# Patient Record
Sex: Male | Born: 1945 | Race: White | Hispanic: No | Marital: Married | Smoking: Former smoker
Health system: Southern US, Community
[De-identification: ages and names within clinical notes are randomized; demographics above are authoritative.]

## PROBLEM LIST (undated history)

## (undated) DIAGNOSIS — Z9189 Other specified personal risk factors, not elsewhere classified: Secondary | ICD-10-CM

## (undated) DIAGNOSIS — K5790 Diverticulosis of intestine, part unspecified, without perforation or abscess without bleeding: Secondary | ICD-10-CM

## (undated) DIAGNOSIS — M545 Low back pain, unspecified: Secondary | ICD-10-CM

## (undated) DIAGNOSIS — K5520 Angiodysplasia of colon without hemorrhage: Secondary | ICD-10-CM

## (undated) DIAGNOSIS — K648 Other hemorrhoids: Secondary | ICD-10-CM

## (undated) DIAGNOSIS — G250 Essential tremor: Secondary | ICD-10-CM

## (undated) DIAGNOSIS — R0600 Dyspnea, unspecified: Secondary | ICD-10-CM

## (undated) DIAGNOSIS — E538 Deficiency of other specified B group vitamins: Secondary | ICD-10-CM

## (undated) DIAGNOSIS — L309 Dermatitis, unspecified: Secondary | ICD-10-CM

## (undated) DIAGNOSIS — J302 Other seasonal allergic rhinitis: Secondary | ICD-10-CM

## (undated) DIAGNOSIS — E785 Hyperlipidemia, unspecified: Secondary | ICD-10-CM

## (undated) DIAGNOSIS — H269 Unspecified cataract: Secondary | ICD-10-CM

## (undated) DIAGNOSIS — I351 Nonrheumatic aortic (valve) insufficiency: Secondary | ICD-10-CM

## (undated) DIAGNOSIS — M47812 Spondylosis without myelopathy or radiculopathy, cervical region: Secondary | ICD-10-CM

## (undated) DIAGNOSIS — R0609 Other forms of dyspnea: Secondary | ICD-10-CM

## (undated) DIAGNOSIS — K5730 Diverticulosis of large intestine without perforation or abscess without bleeding: Secondary | ICD-10-CM

## (undated) DIAGNOSIS — E559 Vitamin D deficiency, unspecified: Secondary | ICD-10-CM

## (undated) DIAGNOSIS — C61 Malignant neoplasm of prostate: Secondary | ICD-10-CM

## (undated) DIAGNOSIS — I517 Cardiomegaly: Secondary | ICD-10-CM

## (undated) DIAGNOSIS — Z8042 Family history of malignant neoplasm of prostate: Secondary | ICD-10-CM

## (undated) DIAGNOSIS — Z806 Family history of leukemia: Secondary | ICD-10-CM

## (undated) DIAGNOSIS — Z87448 Personal history of other diseases of urinary system: Secondary | ICD-10-CM

## (undated) DIAGNOSIS — K635 Polyp of colon: Secondary | ICD-10-CM

## (undated) DIAGNOSIS — Z9089 Acquired absence of other organs: Secondary | ICD-10-CM

## (undated) DIAGNOSIS — Z803 Family history of malignant neoplasm of breast: Secondary | ICD-10-CM

## (undated) DIAGNOSIS — I519 Heart disease, unspecified: Secondary | ICD-10-CM

## (undated) HISTORY — DX: Diverticulosis of intestine, part unspecified, without perforation or abscess without bleeding: K57.90

## (undated) HISTORY — DX: Family history of malignant neoplasm of breast: Z80.3

## (undated) HISTORY — DX: Other specified personal risk factors, not elsewhere classified: Z91.89

## (undated) HISTORY — DX: Acquired absence of other organs: Z90.89

## (undated) HISTORY — PX: TONSILLECTOMY: SUR1361

## (undated) HISTORY — PX: OTHER SURGICAL HISTORY: SHX169

## (undated) HISTORY — DX: Hyperlipidemia, unspecified: E78.5

## (undated) HISTORY — PX: COLONOSCOPY: SHX174

## (undated) HISTORY — DX: Dermatitis, unspecified: L30.9

## (undated) HISTORY — PX: CATARACT EXTRACTION, BILATERAL: SHX1313

## (undated) HISTORY — DX: Family history of leukemia: Z80.6

## (undated) HISTORY — DX: Low back pain: M54.5

## (undated) HISTORY — DX: Family history of malignant neoplasm of prostate: Z80.42

## (undated) HISTORY — DX: Angiodysplasia of colon without hemorrhage: K55.20

## (undated) HISTORY — DX: Essential tremor: G25.0

## (undated) HISTORY — DX: Polyp of colon: K63.5

## (undated) HISTORY — DX: Low back pain, unspecified: M54.50

## (undated) HISTORY — DX: Personal history of other diseases of urinary system: Z87.448

## (undated) HISTORY — PX: POLYPECTOMY: SHX149

---

## 1996-12-09 ENCOUNTER — Encounter: Payer: Self-pay | Admitting: Internal Medicine

## 2001-04-23 ENCOUNTER — Ambulatory Visit (HOSPITAL_COMMUNITY): Admission: RE | Admit: 2001-04-23 | Discharge: 2001-04-23 | Payer: Self-pay | Admitting: Gastroenterology

## 2004-03-15 ENCOUNTER — Ambulatory Visit: Payer: Self-pay | Admitting: Internal Medicine

## 2004-05-08 ENCOUNTER — Ambulatory Visit: Payer: Self-pay | Admitting: Internal Medicine

## 2004-06-13 ENCOUNTER — Ambulatory Visit: Payer: Self-pay | Admitting: *Deleted

## 2004-11-28 ENCOUNTER — Ambulatory Visit (HOSPITAL_COMMUNITY): Admission: RE | Admit: 2004-11-28 | Discharge: 2004-11-29 | Payer: Self-pay | Admitting: Neurosurgery

## 2005-05-13 ENCOUNTER — Ambulatory Visit: Payer: Self-pay | Admitting: Internal Medicine

## 2005-05-23 ENCOUNTER — Ambulatory Visit: Payer: Self-pay | Admitting: Internal Medicine

## 2006-07-02 ENCOUNTER — Ambulatory Visit: Payer: Self-pay | Admitting: Internal Medicine

## 2006-07-02 LAB — CONVERTED CEMR LAB
ALT: 32 units/L (ref 0–40)
AST: 40 units/L — ABNORMAL HIGH (ref 0–37)
Alkaline Phosphatase: 33 units/L — ABNORMAL LOW (ref 39–117)
Basophils Relative: 1.3 % — ABNORMAL HIGH (ref 0.0–1.0)
Bilirubin, Direct: 0.1 mg/dL (ref 0.0–0.3)
CO2: 30 meq/L (ref 19–32)
Calcium: 9.4 mg/dL (ref 8.4–10.5)
Chloride: 107 meq/L (ref 96–112)
Creatinine, Ser: 1 mg/dL (ref 0.4–1.5)
Crystals: NEGATIVE
GFR calc Af Amer: 97 mL/min
GFR calc non Af Amer: 80 mL/min
Glucose, Bld: 106 mg/dL — ABNORMAL HIGH (ref 70–99)
HCT: 42.4 % (ref 39.0–52.0)
HDL: 77.5 mg/dL (ref >39.0)
Hemoglobin: 15.1 g/dL (ref 13.0–17.0)
Leukocytes, UA: NEGATIVE
MCHC: 35.6 g/dL (ref 30.0–36.0)
MCV: 91.9 fL (ref 78.0–100.0)
Monocytes Absolute: 0.4 10*3/uL (ref 0.2–0.7)
Monocytes Relative: 8.2 % (ref 3.0–11.0)
Neutrophils Relative %: 53.6 % (ref 43.0–77.0)
Nitrite: NEGATIVE
Platelets: 184 10*3/uL (ref 150–400)
RBC: 4.61 M/uL (ref 4.22–5.81)
RDW: 12.7 % (ref 11.5–14.6)
Total Protein, Urine: NEGATIVE mg/dL
Total Protein: 6.2 g/dL (ref 6.0–8.3)
Triglycerides: 71 mg/dL (ref 0–149)
VLDL: 14 mg/dL (ref 0–40)
WBC: 4.5 10*3/uL (ref 4.5–10.5)
pH: 6 (ref 5.0–8.0)

## 2006-07-04 ENCOUNTER — Ambulatory Visit: Payer: Self-pay | Admitting: Gastroenterology

## 2006-07-09 ENCOUNTER — Ambulatory Visit: Payer: Self-pay | Admitting: Internal Medicine

## 2006-07-31 ENCOUNTER — Encounter (INDEPENDENT_AMBULATORY_CARE_PROVIDER_SITE_OTHER): Payer: Self-pay | Admitting: *Deleted

## 2006-07-31 ENCOUNTER — Ambulatory Visit: Payer: Self-pay | Admitting: Gastroenterology

## 2006-07-31 LAB — HM COLONOSCOPY

## 2006-08-19 ENCOUNTER — Ambulatory Visit: Payer: Self-pay | Admitting: Internal Medicine

## 2007-05-15 ENCOUNTER — Ambulatory Visit: Payer: Self-pay | Admitting: Internal Medicine

## 2007-07-21 ENCOUNTER — Ambulatory Visit: Payer: Self-pay | Admitting: Internal Medicine

## 2007-07-21 LAB — CONVERTED CEMR LAB
AST: 37 units/L (ref 0–37)
Albumin: 3.9 g/dL (ref 3.5–5.2)
BUN: 14 mg/dL (ref 6–23)
Bacteria, UA: NEGATIVE
Basophils Absolute: 0 10*3/uL (ref 0.0–0.1)
Bilirubin Urine: NEGATIVE
Chloride: 107 meq/L (ref 96–112)
Cholesterol: 176 mg/dL (ref 0–200)
Eosinophils Absolute: 0.1 10*3/uL (ref 0.0–0.6)
Eosinophils Relative: 1.2 % (ref 0.0–5.0)
GFR calc non Af Amer: 80 mL/min
Glucose, Bld: 107 mg/dL — ABNORMAL HIGH (ref 70–99)
Ketones, ur: NEGATIVE mg/dL
MCV: 93.9 fL (ref 78.0–100.0)
Monocytes Absolute: 0.5 10*3/uL (ref 0.2–0.7)
Monocytes Relative: 10.2 % (ref 3.0–11.0)
Neutrophils Relative %: 49.4 % (ref 43.0–77.0)
Platelets: 172 10*3/uL (ref 150–400)
RDW: 12.3 % (ref 11.5–14.6)
Sodium: 142 meq/L (ref 135–145)
Specific Gravity, Urine: 1.025 (ref 1.000–1.03)
Total Protein, Urine: NEGATIVE mg/dL
Total Protein: 6 g/dL (ref 6.0–8.3)
Triglycerides: 72 mg/dL (ref 0–149)
VLDL: 14 mg/dL (ref 0–40)
WBC: 4.9 10*3/uL (ref 4.5–10.5)

## 2007-07-24 ENCOUNTER — Encounter: Payer: Self-pay | Admitting: *Deleted

## 2007-07-24 DIAGNOSIS — Z9089 Acquired absence of other organs: Secondary | ICD-10-CM

## 2007-07-24 DIAGNOSIS — E785 Hyperlipidemia, unspecified: Secondary | ICD-10-CM | POA: Insufficient documentation

## 2007-07-24 DIAGNOSIS — Z87448 Personal history of other diseases of urinary system: Secondary | ICD-10-CM | POA: Insufficient documentation

## 2007-07-24 HISTORY — DX: Acquired absence of other organs: Z90.89

## 2007-07-27 ENCOUNTER — Ambulatory Visit: Payer: Self-pay | Admitting: Internal Medicine

## 2007-07-28 ENCOUNTER — Telehealth: Payer: Self-pay | Admitting: Internal Medicine

## 2008-02-29 ENCOUNTER — Ambulatory Visit: Payer: Self-pay | Admitting: Internal Medicine

## 2008-03-08 ENCOUNTER — Ambulatory Visit: Payer: Self-pay | Admitting: Internal Medicine

## 2008-03-30 ENCOUNTER — Ambulatory Visit: Payer: Self-pay | Admitting: Internal Medicine

## 2008-03-30 LAB — CONVERTED CEMR LAB
ALT: 41 units/L (ref 0–53)
BUN: 15 mg/dL (ref 6–23)
CO2: 29 meq/L (ref 19–32)
Chloride: 106 meq/L (ref 96–112)
Creatinine, Ser: 0.9 mg/dL (ref 0.4–1.5)
GFR calc non Af Amer: 90 mL/min
Glucose, Bld: 109 mg/dL — ABNORMAL HIGH (ref 70–99)
Potassium: 4.2 meq/L (ref 3.5–5.1)
Total CHOL/HDL Ratio: 2.2
Total Protein: 6.3 g/dL (ref 6.0–8.3)
Triglycerides: 59 mg/dL (ref 0–149)
VLDL: 12 mg/dL (ref 0–40)

## 2008-05-02 ENCOUNTER — Ambulatory Visit: Payer: Self-pay | Admitting: Internal Medicine

## 2008-05-02 ENCOUNTER — Ambulatory Visit: Payer: Self-pay | Admitting: Cardiology

## 2008-08-08 ENCOUNTER — Ambulatory Visit: Payer: Self-pay | Admitting: Internal Medicine

## 2008-08-08 LAB — CONVERTED CEMR LAB
ALT: 35 units/L (ref 0–53)
AST: 41 units/L — ABNORMAL HIGH (ref 0–37)
Albumin: 4 g/dL (ref 3.5–5.2)
Alkaline Phosphatase: 39 units/L (ref 39–117)
Bacteria, UA: NEGATIVE
Basophils Relative: 0.5 % (ref 0.0–3.0)
Bilirubin, Direct: 0.1 mg/dL (ref 0.0–0.3)
Chloride: 108 meq/L (ref 96–112)
Cholesterol: 184 mg/dL (ref 0–200)
Crystals: NEGATIVE
HCT: 39.8 % (ref 39.0–52.0)
HDL: 90.1 mg/dL (ref >39.0)
LDL Cholesterol: 84 mg/dL (ref 0–99)
Leukocytes, UA: NEGATIVE
MCHC: 34.8 g/dL (ref 30.0–36.0)
MCV: 94.2 fL (ref 78.0–100.0)
Monocytes Relative: 9.6 % (ref 3.0–12.0)
Mucus, UA: NEGATIVE
Neutrophils Relative %: 49.4 % (ref 43.0–77.0)
Nitrite: NEGATIVE
PSA: 0.87 ng/mL (ref 0.10–4.00)
Platelets: 166 10*3/uL (ref 150–400)
RBC: 4.22 M/uL (ref 4.22–5.81)
Total Bilirubin: 0.9 mg/dL (ref 0.3–1.2)
Total CHOL/HDL Ratio: 2
Total Protein: 6 g/dL (ref 6.0–8.3)
Triglycerides: 49 mg/dL (ref 0–149)
Urobilinogen, UA: 0.2 (ref 0.0–1.0)
WBC: 5.1 10*3/uL (ref 4.5–10.5)
pH: 5.5 (ref 5.0–8.0)

## 2008-08-15 ENCOUNTER — Ambulatory Visit: Payer: Self-pay | Admitting: Internal Medicine

## 2009-06-27 ENCOUNTER — Encounter: Payer: Self-pay | Admitting: Internal Medicine

## 2009-07-18 ENCOUNTER — Encounter: Payer: Self-pay | Admitting: Internal Medicine

## 2009-08-14 ENCOUNTER — Ambulatory Visit: Payer: Self-pay | Admitting: Internal Medicine

## 2009-08-14 LAB — CONVERTED CEMR LAB
AST: 43 units/L — ABNORMAL HIGH (ref 0–37)
Alkaline Phosphatase: 38 units/L — ABNORMAL LOW (ref 39–117)
Basophils Relative: 0.8 % (ref 0.0–3.0)
Bilirubin Urine: NEGATIVE
Bilirubin, Direct: 0.2 mg/dL (ref 0.0–0.3)
CO2: 31 meq/L (ref 19–32)
Cholesterol: 178 mg/dL (ref 0–200)
Creatinine, Ser: 0.8 mg/dL (ref 0.4–1.5)
Eosinophils Relative: 1.7 % (ref 0.0–5.0)
GFR calc non Af Amer: 102.84 mL/min (ref >60)
Lymphocytes Relative: 41.6 % (ref 12.0–46.0)
Lymphs Abs: 2 10*3/uL (ref 0.7–4.0)
MCHC: 33.5 g/dL (ref 30.0–36.0)
MCV: 95.9 fL (ref 78.0–100.0)
Monocytes Absolute: 0.4 10*3/uL (ref 0.1–1.0)
Monocytes Relative: 8.8 % (ref 3.0–12.0)
Neutro Abs: 2.2 10*3/uL (ref 1.4–7.7)
Neutrophils Relative %: 47.1 % (ref 43.0–77.0)
Platelets: 158 10*3/uL (ref 150.0–400.0)
Specific Gravity, Urine: >=1.03 (ref 1.000–1.030)
Total Protein, Urine: NEGATIVE mg/dL
Urine Glucose: NEGATIVE mg/dL
VLDL: 13.8 mg/dL (ref 0.0–40.0)

## 2009-08-17 ENCOUNTER — Ambulatory Visit: Payer: Self-pay | Admitting: Internal Medicine

## 2010-02-08 ENCOUNTER — Encounter: Payer: Self-pay | Admitting: Internal Medicine

## 2010-02-14 ENCOUNTER — Encounter: Payer: Self-pay | Admitting: Internal Medicine

## 2010-04-19 ENCOUNTER — Ambulatory Visit: Payer: Self-pay | Admitting: Vascular Surgery

## 2010-04-19 ENCOUNTER — Ambulatory Visit
Admission: RE | Admit: 2010-04-19 | Discharge: 2010-04-19 | Payer: Self-pay | Source: Home / Self Care | Admitting: Orthopedic Surgery

## 2010-04-19 ENCOUNTER — Encounter: Payer: Self-pay | Admitting: Internal Medicine

## 2010-04-19 ENCOUNTER — Encounter (INDEPENDENT_AMBULATORY_CARE_PROVIDER_SITE_OTHER): Payer: Self-pay | Admitting: Orthopedic Surgery

## 2010-06-07 ENCOUNTER — Encounter: Payer: Self-pay | Admitting: Internal Medicine

## 2010-06-07 ENCOUNTER — Ambulatory Visit
Admission: RE | Admit: 2010-06-07 | Discharge: 2010-06-07 | Payer: Self-pay | Source: Home / Self Care | Attending: Internal Medicine | Admitting: Internal Medicine

## 2010-06-24 ENCOUNTER — Encounter: Payer: Self-pay | Admitting: Cardiovascular Disease

## 2010-06-24 ENCOUNTER — Encounter: Payer: Self-pay | Admitting: Internal Medicine

## 2010-06-24 ENCOUNTER — Encounter: Payer: Self-pay | Admitting: Orthopedic Surgery

## 2010-07-03 NOTE — Letter (Signed)
Summary: [facility]   Imported By: Lennie Odor 03/06/2010 14:50:20  _____________________________________________________________________  External Attachment:    Type:   Image     Comment:   External Document

## 2010-07-03 NOTE — Letter (Signed)
Summary: [facility]  [facility]   Imported By: Lennie Odor 08/02/2009 15:10:10  _____________________________________________________________________  External Attachment:    Type:   Image     Comment:   External Document

## 2010-07-03 NOTE — Letter (Signed)
Summary: [facility]   [facility]   Imported By: Sherian Rein 07/10/2009 10:26:02  _____________________________________________________________________  External Attachment:    Type:   Image     Comment:   External Document

## 2010-07-03 NOTE — Assessment & Plan Note (Signed)
Summary: CPX / [employer] / #/ CD   Vital Signs:  Patient profile:   66 year old male Height:      68 inches Weight:      163 pounds BMI:     24.87 O2 Sat:      98 % on Room air Temp:     97.3 degrees F oral Pulse rate:   63 / minute BP sitting:   136 / 72  (left arm) Cuff size:   regular  Vitals Entered By: Bill Salinas CMA (August 17, 2009 10:09 AM)  O2 Flow:  Room air CC: pt here for physical/he has never had a pneumonia shot and is due for td booster/ ab   Primary Care Provider:  Jacques Navy MD  CC:  pt here for physical/he has never had a pneumonia shot and is due for td booster/ ab.  History of Present Illness: Patient presents for routine medical evaluation.   In the interval since his last visit he did have recurrent back pain. He was seen by Dr. Newell Coral and underwent contrast enhanced MRI of spine which did not reveal any critical degenerative disc disease. His symptoms were managed conservatively with NSAIDs and muscle relaxants with good results. For prevention he does do palides which includes several good back postures.   There have been no other problems and he is feeling well and doing well.   Current Medications (verified): 1)  Aspirin 81 Mg  Tabs (Aspirin) .... Take One Tablet Once Daily 2)  Lipitor 20 Mg  Tabs (Atorvastatin Calcium) .... Take One Tablet Once Daily  Allergies (verified): No Known Drug Allergies  Past History:  Past Medical History: Last updated: 08/15/2008 HEMATURIA, MICROSCOPIC, HX OF (ICD-V13.09) Hx of LOW BACK PAIN, CHRONIC WITH BACK SPASM (ICD-724.2) * Hx of CHRONIC ECZEMA OF THE PLANTAR ASPECT RIGHT FOOT CHICKENPOX, HX OF AS A CHILD (ICD-V15.9) HYPERLIPIDEMIA (ICD-272.4)     Past Surgical History: Last updated: 07/24/2007 * Hx of REPAIR OF DEVIATED SEPTUM * Hx of LEFT L5 HEMILAMINOTOMY WITH MICRODISKECTOMY TONSILLECTOMY AND ADENOIDECTOMY, HX OF (ICD-V45.79)  Family History: Last updated: August 05, 2008 father-deceased @ 19;  CAD/MI-fatal father - deceased @ 31, CAD/MI Maternal Grandmother - DM  He has an extensive family history of       cardiovascular disease  Social History: Last updated: 07/27/2007 JDGala Lewandowsky. [place] 2 daughter: '69, '72   6 grandchildren ( 2 & 4) marriage in good health work: full -time Social worker firm.  Risk Factors: Caffeine Use: 4 (07/27/2007) Exercise: yes (07/27/2007)  Risk Factors: Smoking Status: quit (07/24/2007)  Review of Systems  The patient denies anorexia, fever, weight loss, weight gain, vision loss, decreased hearing, hoarseness, chest pain, syncope, dyspnea on exertion, peripheral edema, prolonged cough, headaches, abdominal pain, melena, hematochezia, severe indigestion/heartburn, hematuria, incontinence, muscle weakness, suspicious skin lesions, difficulty walking, depression, unusual weight change, enlarged lymph nodes, angioedema, and testicular masses.    Physical Exam  General:  Well nourished and developed male who looks very fit and trim.  Head:  Normocephalic and atraumatic without obvious abnormalities. No apparent alopecia or balding. Eyes:  No corneal or conjunctival inflammation noted. EOMI. Perrla. Funduscopic exam benign, without hemorrhages, exudates or papilledema. Vision grossly normal. Ears:  External ear exam shows no significant lesions or deformities.  Otoscopic examination reveals clear canals, tympanic membranes are intact bilaterally without bulging, retraction, inflammation or discharge. Hearing is grossly normal bilaterally. Nose:  no external deformity and no external erythema.   Mouth:  Oral  mucosa and oropharynx without lesions or exudates.  Teeth in good repair. Neck:  supple, full ROM, no masses, no thyromegaly, and no carotid bruits.   Chest Wall:  No deformities, masses, tenderness or gynecomastia noted. Lungs:  Normal respiratory effort, chest expands symmetrically. Lungs are clear to auscultation, no crackles or wheezes. Heart:   Normal rate and regular rhythm. S1 and S2 normal without gallop, murmur, click, rub or other extra sounds. Abdomen:  soft, non-tender, normal bowel sounds, no distention, no guarding, and no hepatomegaly.   Rectal:  No external abnormalities noted. Normal sphincter tone. No rectal masses or tenderness. Prostate:  Prostate gland firm and smooth, no enlargement, nodularity, tenderness, mass, asymmetry or induration. Msk:  normal ROM, no joint tenderness, no joint swelling, no joint warmth, no joint deformities, and no joint instability.  Normal range of motion back with no palpable tenderness or muscle spasm Pulses:  2+ radial and DP pulses Extremities:  No clubbing, cyanosis, edema, or deformity noted with normal full range of motion of all joints.   Neurologic:  alert & oriented X3, cranial nerves II-XII intact, strength normal in all extremities, gait normal, and DTRs symmetrical and normal.   Skin:  turgor normal, color normal, and no ulcerations.  2 CM cyst, firm and non-fluctuant in the right anterior axilla with a comedone. Cervical Nodes:  no anterior cervical adenopathy and no posterior cervical adenopathy.   Axillary Nodes:  no R axillary adenopathy.   Psych:  Oriented X3, memory intact for recent and remote, normally interactive, and good eye contact.     Impression & Recommendations:  Problem # 1:  HEMATURIA, MICROSCOPIC, HX OF (ICD-V13.09) U/A with 0-2 RBCs c/w history of chronic microscopic hematuria. This was previously evaluated by Larey Dresser.  Problem # 2:  Hx of LOW BACK PAIN, CHRONIC WITH BACK SPASM (ICD-724.2) Symptoms resolved.  Plan - continue back exercises as part of palides work-out  His updated medication list for this problem includes:    Aspirin 81 Mg Tabs (Aspirin) .Marland Kitchen... Take one tablet once daily  Problem # 3:  HYPERLIPIDEMIA (ICD-272.4) Excellent control on present medication with negative LDL/HDL ratio putting him at low risk based on lipids.   Plan -  continue present medications.   His updated medication list for this problem includes:    Lipitor 20 Mg Tabs (Atorvastatin calcium) .Marland Kitchen... Take one tablet once daily  Problem # 4:  Preventive Health Care (ICD-V70.0) Unremarkable history except for flare of back pain. Normal physical exam. Lab results are excellent. He is current with colorectal cancer screening and prostate cancer screening. Immunizations are up-to-date with pneumovax at today's visit. He continues a very healthy life-style, including exercising routinely  In summary - a very nice gentleman who is medically stable and doing well. He will return as needed or in 1 year.   Complete Medication List: 1)  Aspirin 81 Mg Tabs (Aspirin) .... Take one tablet once daily 2)  Lipitor 20 Mg Tabs (Atorvastatin calcium) .... Take one tablet once daily  Other Orders: TD Toxoids IM 7 YR + (16109) Pneumococcal Vaccine (60454) Admin 1st Vaccine (09811) Admin of Any Addtl Vaccine (91478)      Patient: Gwynneth Macleod Note: All result statuses are Final unless otherwise noted.  Tests: (1) Lipid Panel (LIPID)   Cholesterol               178 mg/dL  0-200     ATP III Classification            Desirable:  < 200 mg/dL                    Borderline High:  200 - 239 mg/dL               High:  > = 240 mg/dL   Triglycerides             69.0 mg/dL                  0.4-540.9     Normal:  <150 mg/dL     Borderline High:  811 - 199 mg/dL   HDL                       91.47 mg/dL                 >82.95   VLDL Cholesterol          13.8 mg/dL                  6.2-13.0   LDL Cholesterol           75 mg/dL                    8-65  CHO/HDL Ratio:  CHD Risk                             2                    Men          Women     1/2 Average Risk     3.4          3.3     Average Risk          5.0          4.4     2X Average Risk          9.6          7.1     3X Average Risk          15.0          11.0                            Tests: (2) BMP (METABOL)   Sodium                    144 mEq/L                   135-145   Potassium                 4.0 mEq/L                   3.5-5.1   Chloride                  110 mEq/L                   96-112   Carbon Dioxide            31 mEq/L                    19-32   Glucose              [  H]  104 mg/dL                   30-86   BUN                       14 mg/dL                    5-78   Creatinine                0.8 mg/dL                   4.6-9.6   Calcium                   9.2 mg/dL                   2.9-52.8   GFR                       102.84 mL/min               >60  Tests: (3) CBC Platelet w/Diff (CBCD)   White Cell Count          4.7 K/uL                    4.5-10.5   Red Cell Count            4.38 Mil/uL                 4.22-5.81   Hemoglobin                14.1 g/dL                   41.3-24.4   Hematocrit                42.1 %                      39.0-52.0   MCV                       95.9 fl                     78.0-100.0   MCHC                      33.5 g/dL                   01.0-27.2   RDW                       12.1 %                      11.5-14.6   Platelet Count            158.0 K/uL                  150.0-400.0   Neutrophil %              47.1 %                      43.0-77.0   Lymphocyte %              41.6 %  12.0-46.0   Monocyte %                8.8 %                       3.0-12.0   Eosinophils%              1.7 %                       0.0-5.0   Basophils %               0.8 %                       0.0-3.0   Neutrophill Absolute      2.2 K/uL                    1.4-7.7   Lymphocyte Absolute       2.0 K/uL                    0.7-4.0   Monocyte Absolute         0.4 K/uL                    0.1-1.0  Eosinophils, Absolute                             0.1 K/uL                    0.0-0.7   Basophils Absolute        0.0 K/uL                    0.0-0.1  Tests: (4) Hepatic/Liver Function Panel (HEPATIC)   Total Bilirubin           0.7  mg/dL                   1.6-1.0   Direct Bilirubin          0.2 mg/dL                   9.6-0.4   Alkaline Phosphatase [L]  38 U/L                      39-117   AST                  [H]  43 U/L                      0-37   ALT                       38 U/L                      0-53   Total Protein             6.2 g/dL                    5.4-0.9   Albumin                   4.0 g/dL                    8.1-1.9  Tests: (5) TSH (TSH)  FastTSH                   1.50 uIU/mL                 0.35-5.50  Tests: (6) Prostate Specific Antigen (PSA)   PSA-Hyb                   1.02 ng/mL                  0.10-4.00  Tests: (7) UDip w/Micro (URINE)   Color                     YELLOW       RANGE:  Yellow;Lt. Yellow   Clarity                   CLEAR                       Clear   Specific Gravity          >=1.030                     1.000 - 1.030   Urine Ph                  5.5                         5.0-8.0   Protein                   NEGATIVE                    Negative   Urine Glucose             NEGATIVE                    Negative   Ketones                   NEGATIVE                    Negative   Urine Bilirubin           NEGATIVE                    Negative   Blood                     MODERATE                    Negative   Urobilinogen              0.2                         0.0 - 1.0   Leukocyte Esterace        NEGATIVE                    Negative   Nitrite                   NEGATIVE                    Negative   Urine WBC                 0-2/hpf  0-2/hpf   Urine RBC                 3-6/hpf                     0-2/hpf   Urine Epith               Rare(0-4/hpf)               Rare(0-4/hpf)   Urine Bacteria            Rare(<10/hpf)               None  Immunizations Administered:  Tetanus Vaccine:    Vaccine Type: Td    Site: left deltoid    Mfr: Sanofi Pasteur    Dose: 0.5 ml    Route: IM    Given by: Ami Bullins CMA    Exp. Date: 04/18/2011    Lot #: [id number]    VIS  given: 04/21/07 version given August 17, 2009.  Pneumonia Vaccine:    Vaccine Type: Pneumovax    Site: right deltoid    Mfr: Merck    Dose: 0.5 ml    Route: IM    Given by: Ami Bullins CMA    Exp. Date: 01/15/2011    Lot #: [id number]    VIS given: 12/30/95 version given August 17, 2009.

## 2010-07-03 NOTE — Letter (Signed)
Summary: [facility]  [facility]   Imported By: Sherian Rein 05/02/2010 08:24:10  _____________________________________________________________________  External Attachment:    Type:   Image     Comment:   External Document

## 2010-07-03 NOTE — Letter (Signed)
Summary: [facility]   Imported By: Sherian Rein 02/28/2010 14:30:46  _____________________________________________________________________  External Attachment:    Type:   Image     Comment:   External Document

## 2010-07-05 NOTE — Assessment & Plan Note (Signed)
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  His past medical history is notable for hyperlipidemia which has been followed very closely previously by Dr. Corinda Gubler.  He has never had a catheterization, but has had several stress tests which have all been at low risk.  Last ETT 11/09 with excellent exercise cpacity and no ST-T changes.   He returns today for 2-year f/u. Doing very well. Remains very active but has been limited by torn cartilage in his L knee recently. He is doing Pilates and riding on a bike. He has not had any chest pain or shortness of breath.  He has had his lipids checked with Dr. Arthur Holms in March. TC 178 TG 69 HDL 89 LDL 75.  Due for recheck soon.   Current Medications (verified): 1)  Aspirin 81 Mg  Tabs (Aspirin) .... Take One Tablet Once Daily 2)  Lipitor 20 Mg  Tabs (Atorvastatin Calcium) .... Take One Tablet Once Daily  Allergies (verified): No Known Drug Allergies  Past History:  Past Medical History: Last updated: 08/15/2008 HEMATURIA, MICROSCOPIC, HX OF (ICD-V13.09) Hx of LOW BACK PAIN, CHRONIC WITH BACK SPASM (ICD-724.2) * Hx of CHRONIC ECZEMA OF THE PLANTAR ASPECT RIGHT FOOT CHICKENPOX, HX OF AS A CHILD (ICD-V15.9) HYPERLIPIDEMIA (ICD-272.4)     Review of Systems       As per HPI and past medical history; otherwise all systems negative.   Vital Signs:  Patient profile:   65 year old male Height:      68 inches Weight:      167 pounds BMI:     25.48 Pulse rate:   64 / minute BP sitting:   114 / 68  Physical Exam  General:  Gen: well appearing. no resp difficulty HEENT: normal Neck: supple. no JVD. Carotids 2+ bilat; no bruits. No lymphadenopathy or thryomegaly appreciated. Cor: PMI nondisplaced. Regular rate & rhythm. No rubs, gallops, murmur. Lungs:  clear Abdomen: soft, nontender, nondistended. No hepatosplenomegaly. No bruits or masses. Good bowel sounds. Extremities: no cyanosis, clubbing, rash, edema Neuro: alert & orientedx3, cranial nerves grossly intact. moves all 4 extremities w/o difficulty. affect pleasant    Impression & Recommendations:  Problem # 1:  HYPERLIPIDEMIA (ICD-272.4) Lipids look very good. Continue current regimen.  Problem # 2:  SCREENING FOR ISCHEMIC HEART DISEASE (ICD-V81.0)  Given his strong FHX of CAD we spent a long time talkig about various aspects of risk reduction and also the best plan for surveillance for developing CAD. Given his current activity le vel I do not think we need to pursue another stress test at this point, however we did disucss the possiblity of doing a cardiac CT to visualize his coronary plaque burden at some point in the future.   Other Orders: EKG w/ Interpretation (93000)  Patient Instructions: 1)  Your physician recommends that you schedule a follow-up appointment in: 1 year with Dr. Gala Romney 2)  Your physician recommends that you continue on your current medications as directed. Please refer to the Current Medication list given to you today. Prescriptions: LIPITOR 20 MG  TABS (ATORVASTATIN CALCIUM) Take one tablet once daily  #90 Tablet x 3   Entered by:   Lisabeth Devoid RN   Authorized by:   Dolores Patty, MD, [facility]   Signed by:   Lisabeth Devoid  RN on 06/07/2010   Method used:   Electronically to        [facility] [phone]* (retail)       [address]       [place], [place]  [address]       Ph: [phone] or [phone]       Fax: [phone]   RxID:   [id number]

## 2010-10-16 NOTE — Procedures (Signed)
[facility]                              EXERCISE TREADMILL   NAME:Rocca, ESTUARDO FRISBEE                     MRN:          [id number]  DATE:05/02/2008                            DOB:          26-Jul-1945    PATIENT INDICATIONS:  Adam Gaines is a delightful 65 year old male with history  of hyperlipidemia and a very strong family history of coronary artery  disease.  He is brought in for a routine treadmill testing for risk  stratification.   BASELINE DATA:  His resting blood pressure is 127/79 with a heart rate  of 68.  EKG showed normal sinus rhythm with no ST-T wave abnormalities.   EXERCISE DATA:  He walked for 12-1/2 minutes of standard Bruce protocol  reaching a peak heart rate of 166 beats per minute.  This was 106% of  his predicted maximal heart rate.  Total METs were 14.3.  There was no  chest pain.  Treadmill test was stopped due to fatigue.  Peak blood  pressure was 199/80.  There were no ST-T wave changes with exercise.   EARLY RECOVERY DATA:  Heart rate at 1 minute was down to 130 with  excellent heart rate recovery.   ASSESSMENT:  1. This is a normal exercise stress test showing excellent exercise      tolerance with no evidence of stress-induced ischemia.  2. Brisk heart rate recovery which is an excellent prognostic sign.     Bevelyn Buckles. Bensimhon, MD  Electronically Signed    DRB/MedQ  DD: 05/02/2008  DT: 05/03/2008  Job #: [id number]

## 2010-10-16 NOTE — Assessment & Plan Note (Signed)
[facility]                            CARDIOLOGY OFFICE NOTE   NAME:Adam Gaines, Adam Gaines                     MRN:          [id number]  DATE:03/08/2008                            DOB:          December 10, 1945    PRIMARY CARE PHYSICIAN:  Adam Gess. 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  His past  medical history is notable for hyperlipidemia which has been followed  very closely previously by Dr. Corinda Gaines.  He has never had a  catheterization, but has had several stress tests which have all been at  low risk.   He returns today for followup, we had not seen him for about a year and  half.  He is doing quite well.  He is doing Pilates and walking on the  treadmill about 3 times a week, but is frustrated that he is not more  active.  He has not had any chest pain or shortness of breath.  He has  had his lipids checked with Dr. Arthur Gaines and says they were pretty good.   Review of systems is negative except for problem list and interval  history.   CURRENT MEDICATIONS:  1. Aspirin 81 a day.  2. Lipitor 20 mg a day.   PHYSICAL EXAMINATION:  GENERAL:  He is well appearing in no acute  distress, ambulates in the clinic without any respiratory difficulty.  VITAL SIGNS:  Blood pressure is 114/70, heart rate 61 weight is 167.  HEENT:  Normal.  NECK:  Supple.  There is no JVD.  Carotids are 2+ bilaterally without  bruits.  There is no lymphadenopathy or thyromegaly.  CARDIAC:  He has  regular rate and rhythm.  No murmurs, rubs, or gallops.  LUNGS:  Clear.  ABDOMEN:  Soft, nontender, nondistended, no  hepatosplenomegaly, no bruits, no masses.  Good bowel sounds.  EXTREMITIES:  Warm with no cyanosis, clubbing, or edema.  No no rash.  NEURO:  Alert and oriented x3.  Cranial cranial nerves II through XII  are intact, moves all 4 extremities without difficulty.   Affect is  pleasant.   EKG shows sinus rhythm, rate of 61 with no ST-T wave abnormalities.   ANCILLARY DATA:  Total cholesterol is 176, HDL 76, LDL of 85,  triglycerides 72.  Fasting blood sugar is 107 which has been elevated  for at least 10 years, creatinine is normal at 1.0.  LFTs are normal.   ASSESSMENT AND PLAN:  1. Hyperlipidemia.  His lipids look pretty good.  His HDL was      fantastic.  His LDL was not in the low category, but I think it is      probably acceptable.  We will go ahead and get a CRP and make sure      that this is less than 1 and if it not, we would suggest pushing      his LDL down even further by raising his Lipitor to 40.  2. Cardiovascular screening.  He has an extensive  family history of      cardiovascular disease.  He is due to for his routine screening      treadmill test.  3. Exercise.  I congratulated him on what he is doing, but reminded      him that it be nice if he could exercise on a more regular basis.     Adam Buckles. Bensimhon, MD  Electronically Signed    DRB/MedQ  DD: 03/08/2008  DT: 03/09/2008  Job #: [id number]

## 2010-10-19 NOTE — Assessment & Plan Note (Signed)
[facility]                           PRIMARY CARE OFFICE NOTE   NAME:Hewes, SAY FRIPP                     MRN:          [id number]  DATE:07/09/2006                            DOB:          Jul 11, 1945    Mr. Brodzik is a pleasant 64 year old gentleman who presents today for  followup evaluation and exam.  He was last seen in the office May 23, 2005.   INTERNAL HISTORY:  Unremarkable.  The patient is continuing to have good  health and doing well.   The patient had several complaints today, including the advantage or  benefit of taking vitamin supplements, the advisability of taking  Zostavax immunization, and we discussed his recent dermatologic  evaluation and workup for a persistent rash on his foot.   PAST SURGICAL HISTORY:  1. Tonsillectomy with adenoidectomy.  2. Left L5 hemilaminotomy with microdiskectomy November 28, 2004, for a      herniated L5-S1 disk.   MEDICAL ILLNESSES:  1. Chicken pox as a child.  2. Chronic eczema of the plantar aspect of his right foot, currently      under evaluation by Dr. Mayford Knife.  3. Back spasm with chronic back pain.  4. Remote scrotal swelling, resolved.  5. Microhematuria, fully evaluated by Dr. Vonita Moss.  6. Hyperlipidemia.   PHYSICIAN ROSTER:  DERMATOLOGY:  Dr. Dorinda Hill.  ORTHOPEDICS:  Dr. Thurston Hole.  GI:  Dr. Victorino Dike.  CARDIOLOGY:  Dr. Gabriel Rung [place]/Dr. Bensimhon.  OPHTHALMOLOGY:  Dr. Nile Riggs.   CURRENT MEDICATIONS:  1. Lipitor 20 mg daily.  2. Baby aspirin 81 mg daily.   HABITS:  Tobacco:  None.  Alcohol:  4 to 6 ounces per week.   ALLERGIES:  The patient has no known drug allergies.   FAMILY HISTORY AND SOCIAL HISTORY:  Are well-documented in my note of  May 08, 2004, without change.   CHART REVIEW:  Last colonoscopy April 23, 2001.  No hospitalizations  except November 28, 2004.  Correspondence from Violeta Gelinas August 19, 2005  for excision and drainage of an infected cyst  of the right axilla.  Last  x-rays related lumbar discectomy.   Exercise treadmill performed June 13, 2004, by Dr. Glennon Hamilton which  was negative.   REVIEW OF SYSTEMS:  The patient has had no fever, sweats or chills.  He  has had a 3-pound weight gain.  It has been greater than 12 months since  his last eye exam.  No ENT, cardiovascular, respiratory or GI  complaints.  GU is significant for nocturia x1 with mild symptoms of  prostatism.  No musculoskeletal complaints.  Derm with a rash on his  plantar aspect of his right foot as noted.  Recurrent axillary cyst on  the right.  Neuro and psych negative.   PHYSICAL EXAMINATION:  VITAL SIGNS:  Temperature was 98, blood pressure  135/65, pulse 93, weight 166.  GENERAL APPEARANCE:  This is a trim gentleman looking younger than his  stated chronologic age, in no acute distress.  HEENT:  Normocephalic, atraumatic.  EACs and TMS were normal.  Oropharynx with native dentition in good  repair.  There were no buccal  or palatal lesions were noted.  Posterior pharynx was clear,  conjunctivae and sclerae were clear.  PERRLA, EOMI.  Funduscopic exam  with hand-held instrument was unremarkable with normal disk margins and  no vascular abnormality.  NECK:  Supple without thyromegaly.  NODES:  No adenopathy was noted in the cervical or supraclavicular  regions.  No axillary adenopathy was appreciated.  CHEST:  No CVA tenderness.  LUNGS:  Clear to auscultation and percussion.  CARDIOVASCULAR:  2+ radial pulses, no JVD, no carotid bruits.  He had a  quiet precordium with a regular rate and rhythm, without murmurs, rubs  or gallops.  ABDOMEN:  Soft, no guarding, no rebound, no organosplenomegaly was  appreciated.  GENITALIA:  Normal male phallus, bilaterally descended testicles without  masses.  RECTAL:  Normal sphincter tone was noted.  Prostate was small, round,  normal size and contour without nodules.  EXTREMITIES:  Without clubbing,  cyanosis, edema or deformity.  NEUROLOGIC:  Exam was nonfocal.  DERMATOLOGIC:  The patient has an unusual-appearing rash at the plantar  aspect of his right foot, but no other skin lesions were noted.   LABORATORY:  Hemoglobin was 15.1 grams and normal.  White count was 4500  with a normal differential.  Chemistries were unremarkable, with a serum  glucose of 106.  Kidney function was normal with a creatinine of 1.0 and  a GFR of 80 mL per minute.  Liver functions revealed an SGOT 3 points  above normal at 40, SGPT normal at 32.  Cholesterol is 184,  triglycerides 71, HDL 77.5, LDL was 92.  Thyroid function normal with a  TSH of 1.17.  PSA was normal at 0.83.  Urinalysis was negative.   ASSESSMENT AND PLAN:  1. Hyperlipidemia.  Excellent control on Lipitor with no significant      abnormalities on laboratory.  PLAN:  The patient to continue on his      present dose.  2. Dermatologic.  The patient recently had biopsy of his foot.  The      sutures are still in place.  The patient will be seeing Dr.      Dorinda Hill February 8 for suture removal and diagnosis.  3. Neurosurgery.  The patient has made an excellent recovery from his      back surgery.  He has virtually no residua except for a very faint      foot drop on the left, worse when he is tired and worse when on      carpet.  4. Health maintenance.  The patient is scheduled to have colonoscopy      with Dr. Victorino Dike in the next week.  The patient will be referred to Dr. Arvilla Meres to establish with a  new cardiologist in light of Dr. Blossom Hoops retirement, and we will defer  any cardiac testing to Dr. Gala Romney.   In summary this is a very delightful gentleman who is medically stable  and in good health.  He will keep his appointment for colonoscopy and  for establishing with a new cardiologist as noted.  I have that he have  Dr. Mayford Knife send me a report in regards to his dermatologic diagnosis.    Rosalyn Gess  Norins, MD  Electronically Signed    MEN/MedQ  DD: 07/10/2006  DT: 07/10/2006  Job #: [id number]   cc:   Julieta Bellini. Bensimhon, MD  Dollene Cleveland, M.D.

## 2010-10-19 NOTE — Assessment & Plan Note (Signed)
[facility]                            CARDIOLOGY OFFICE NOTE   NAME:Adam Gaines                     MRN:          [id number]  DATE:08/19/2006                            DOB:          Dec 17, 1945    REFERRING PHYSICIAN:  Rosalyn Gess. Norins, MD   REASON FOR EVALUATION:  Cardiovascular evaluation.   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 Glennon Hamilton.  He presents today to establish long-term care in the  face of Dr. Blossom Hoops impending retirement.   PAST MEDICAL HISTORY:  Notable for hyperlipidemia.  He denies any  history of hypertension or diabetes.  He is quite active.  He walks on  the treadmill twice a week, 4 miles at 4-5% grade for 30 minutes without  any chest pain or shortness of breath.  He also does [employer] 3 days a  week.  He has not had any heart failure symptoms.   FAMILY HISTORY:  Quite impressive for cardiovascular disease.  His  mother died at 42 due to a massive stroke, and she was a heavy cigarette  smoker.  Father died at age 56 due to a massive MI.  He does have two  brothers who are in good health.   He had a previous screening stress test several years ago which was  normal.  He has never had a Myoview or cardiac catheterization.   PAST MEDICAL HISTORY:  1. Hyperlipidemia.  2. Strong family history of cardiovascular disease.   CURRENT MEDICATIONS:  1. Aspirin 81 mg a day.  2. Lipitor 20 mg a day.   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  He is married.  He  has two daughters and five grand kids with one more on the way.  He does  not smoke.  He does drink occasional alcohol.   FAMILY HISTORY:  As per HPI.   REVIEW OF SYSTEMS:  As per HPI and Problem List; otherwise, all systems  are negative.   PHYSICAL EXAMINATION:  GENERAL:  He is a healthy-appearing male,  appearing younger than his  stated age.  He ambulates around the clinic  without any respiratory difficulty.  VITAL SIGNS: Blood pressure 122/82, heart rate 68. Weight is 168.  HEENT:  Sclerae anicteric.  EOMI. There is no xanthelasma.  Mucous  membranes are moist.  Oropharynx is clear.  NECK:  Supple, no JVD.  Carotids are 2+ bilaterally without bruits.  There is no lymphadenopathy or thyromegaly.  CARDIAC: Regular rate and rhythm.  No murmurs, rubs, or gallops.  LUNGS: Clear.  ABDOMEN:  Soft and nontender, nondistended.  His abdominal aortic  impulse is not widened.  No bruits, no masses.  Good bowel sounds.  EXTREMITIES:  Warm with no cyanosis, clubbing, or edema.  There are good  pulses.  SKIN:  No rashes.  NEUROLOGIC:  He is alert and oriented x3.  Cranial nerves II-XII intact.  Moves all four extremities without difficulty.   Lab work from July 02, 2006,  shows creatinine of 1.0, BUN of 18,  glucose of 106.  CBC is normal.  AST is 40, ALT 32.  Total cholesterol  is 184, triglycerides 71, HDL 78, LDL 92.   EKG shows normal sinus rhythm at a rate of 63 with no significant ST-T  wave abnormalities.  There is normal axis and intervals.   ASSESSMENT AND PLAN:  1. Hyperlipidemia.  He is on Lipitor with good control.  He has a very      high HDL.  2. Screening for cardiovascular disease.  I think it is reasonable to      go ahead and repeat his stress test given his family history and      the fact that he has not had one in some time.  We talked about the      possibilities including cardiopulmonary exercise testing, a      treadmill Myoview, or possible cardiac CT.  He is interested in the      cardiac CT, and we will see if we can set this up with Dr. Eden Emms.  3. Glucose intolerance.  I have asked him to follow up with Dr. Debby Bud      for followup check on this within a few months.     Bevelyn Buckles. Bensimhon, MD  Electronically Signed    DRB/MedQ  DD: 08/19/2006  DT: 08/20/2006  Job #: [id number]

## 2010-10-19 NOTE — Op Note (Signed)
NAME:  Adam Gaines, Adam Gaines NO.:  [id number]   MEDICAL RECORD NO.:  [id number]          PATIENT TYPE:  OIB   LOCATION:  2899                         FACILITY:  [facility]   PHYSICIAN:  Hewitt Shorts, M.D.DATE OF BIRTH:  [birth year]   DATE OF PROCEDURE:  11/28/2004  DATE OF DISCHARGE:                                 OPERATIVE REPORT   PREOPERATIVE DIAGNOSIS:  Left L5 lumbar disk herniation.   POSTOPERATIVE DIAGNOSIS:  Left L5 lumbar disk herniation.   PROCEDURE:  Left L5 hemilaminotomy and microdiskectomy.   SURGEON:  Dr. Newell Coral.   ANESTHESIA:  General endotracheal.   INDICATIONS:  This is a 65 year old man who presented with a left foot drop  and pain, is found to have a disk herniation behind the body of L5.  I  suspect it most likely arose from the L5-S1 disk level and extruded  rostrally.  A decision was made proceed with microdiskectomy.   PROCEDURE:  The patient was brought to the operating room and placed under  general endotracheal anesthesia.  The patient was turned to a prone  position.  The lumbar region was prepped with Betadine soap and solution and  draped in a sterile fashion. The midline was infiltrated with local  anesthetic with epinephrine and a midline incision was made after an x-ray  was taken to localize the L5 level. Dissection was carried down through the  subcutaneous tissue with bipolar electrocautery to maintain hemostasis.  Dissection was carried down to the lumbar fascia which was incised on the  left side of the midline. The paraspinal muscles were dissected from the  spinous process of the lamina in a subperiosteal fashion. The L5 lamina was  identified. An x-ray was taken to confirm the localization and then the  microscope was draped and brought into the field and applied additional  magnification,  illumination and visualization.  The remainder of the  decompression was performed using microdissection and microsurgical  technique. A left L5 hemilaminotomy was performed using the X Max drill and  Kerrison punches.  The ligamentum flavum was carefully removed and edges of  the bone were carefully smoothed to prevent any sharp edges. We then gently  retracted the thecal sac medially and identified the disk herniation.  The  fragment which was behind the body of L5 was removed in a piecemeal fashion.  We were able to use a micro hook and remove the fragment that extended  laterally into the left L5-S1 neural foramen. We examined the disk space.  The annulus appeared intact.  It is suspected that the point of extrusion  had sealed over.  We therefore completed the diskectomy, removing all loose  fragments and disk material and fragments from the epidural space and  achieving good decompression of the thecal sac and exiting L5 nerve root and  left L5-S1 neural foramen.  It was irrigated bacitracin solution, checked  for hemostasis which was established with the use of bipolar cautery and  Gelfoam soaked in thrombin.  All the Gelfoam was removed prior to closure.  Once the hemostasis was established, we instilled 2 cc  of fentanyl and 80 mg  of Depo-Medrol into the epidural space and proceeded with closure. The deep  fascia was closed with interrupted undyed 1 Vicryl suture. The deep  subcutaneous layer was closed with interrupted inverted undyed 2-0 Vicryl  sutures and the subcutaneous and subcuticular layer closed with interrupted,  infiltrated 2-0 undyed Vicryl sutures.  The skin  was approximated with Dermabond. The procedures was tolerated well.  The  estimated blood loss was 25 cc. Sponge and instrument count was correct.  Following the surgery, the patient was turned back to the supine position to  be reversed from anesthetic, extubated and transferred to the recovery room  for further care.       RWN/MEDQ  D:  11/28/2004  T:  11/28/2004  Job:  [id number]

## 2010-11-26 ENCOUNTER — Other Ambulatory Visit (INDEPENDENT_AMBULATORY_CARE_PROVIDER_SITE_OTHER): Payer: Self-pay

## 2010-11-26 ENCOUNTER — Other Ambulatory Visit: Payer: Self-pay | Admitting: Internal Medicine

## 2010-11-26 ENCOUNTER — Other Ambulatory Visit (INDEPENDENT_AMBULATORY_CARE_PROVIDER_SITE_OTHER): Payer: Self-pay | Admitting: Internal Medicine

## 2010-11-26 DIAGNOSIS — Z0000 Encounter for general adult medical examination without abnormal findings: Secondary | ICD-10-CM

## 2010-11-26 DIAGNOSIS — Z0389 Encounter for observation for other suspected diseases and conditions ruled out: Secondary | ICD-10-CM

## 2010-11-26 LAB — PSA: PSA: 1.14 ng/mL (ref 0.10–4.00)

## 2010-11-26 LAB — URINALYSIS, ROUTINE W REFLEX MICROSCOPIC
Bilirubin Urine: NEGATIVE
Ketones, ur: NEGATIVE
Nitrite: NEGATIVE
Specific Gravity, Urine: >=1.03 (ref 1.000–1.030)
Urobilinogen, UA: 0.2 (ref 0.0–1.0)
pH: 5.5 (ref 5.0–8.0)

## 2010-11-26 LAB — CBC WITH DIFFERENTIAL/PLATELET
Eosinophils Absolute: 0.1 10*3/uL (ref 0.0–0.7)
Eosinophils Relative: 2.2 % (ref 0.0–5.0)
Neutro Abs: 2.8 10*3/uL (ref 1.4–7.7)
Platelets: 170 10*3/uL (ref 150.0–400.0)
RDW: 13.5 % (ref 11.5–14.6)

## 2010-11-26 LAB — HEPATIC FUNCTION PANEL
ALT: 31 U/L (ref 0–53)
Alkaline Phosphatase: 41 U/L (ref 39–117)
Bilirubin, Direct: 0.1 mg/dL (ref 0.0–0.3)
Total Bilirubin: 0.5 mg/dL (ref 0.3–1.2)
Total Protein: 6.1 g/dL (ref 6.0–8.3)

## 2010-11-26 LAB — LIPID PANEL
Cholesterol: 174 mg/dL (ref 0–200)
HDL: 81.2 mg/dL (ref >39.00)
Total CHOL/HDL Ratio: 2
Triglycerides: 37 mg/dL (ref 0.0–149.0)

## 2010-11-26 LAB — BASIC METABOLIC PANEL
BUN: 18 mg/dL (ref 6–23)
Calcium: 9 mg/dL (ref 8.4–10.5)
Chloride: 108 mEq/L (ref 96–112)
Creatinine, Ser: 0.7 mg/dL (ref 0.4–1.5)
Sodium: 140 mEq/L (ref 135–145)

## 2010-11-27 ENCOUNTER — Encounter: Payer: Self-pay | Admitting: Internal Medicine

## 2010-11-28 ENCOUNTER — Encounter: Payer: Self-pay | Admitting: Internal Medicine

## 2010-11-28 ENCOUNTER — Ambulatory Visit (INDEPENDENT_AMBULATORY_CARE_PROVIDER_SITE_OTHER): Payer: BC Managed Care – PPO | Admitting: Internal Medicine

## 2010-11-28 DIAGNOSIS — E785 Hyperlipidemia, unspecified: Secondary | ICD-10-CM

## 2010-11-28 DIAGNOSIS — Z0000 Encounter for general adult medical examination without abnormal findings: Secondary | ICD-10-CM
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
## 2010-11-29 DIAGNOSIS — Z0000 Encounter for general adult medical examination without abnormal findings: Secondary | ICD-10-CM | POA: Insufficient documentation

## 2010-11-29 NOTE — Assessment & Plan Note (Addendum)
Inteval history medically unremarkable although he has been a little hard on himself physically. Physical exam is normal. Lab results are excellent. PSA is in normal range and stable without significant increase from previous. He is current with colorectal cancer screening with last exam February '08. Immunization: Tetanus Mrch '11; pneumonia vaccine March '11 and shingles vaccine Feb '08. He was seen by cardiology Jan '12 with a normal exam and EKG. He has had cardiac stress testing that was normal.  In summary - a very nice man who is medically stable and doing well. He will return as needed or in one year.

## 2010-11-29 NOTE — Assessment & Plan Note (Signed)
Very good control on present medication with no adverse side effects or problems. LDL/HDL ratio at unity - protective.  Plan - continue present medication and life-style management

## 2011-04-15 ENCOUNTER — Other Ambulatory Visit: Payer: Self-pay | Admitting: Internal Medicine

## 2011-05-07 ENCOUNTER — Encounter: Payer: Self-pay | Admitting: Internal Medicine

## 2011-06-10 ENCOUNTER — Encounter: Payer: Self-pay | Admitting: Internal Medicine

## 2011-06-10 ENCOUNTER — Ambulatory Visit (INDEPENDENT_AMBULATORY_CARE_PROVIDER_SITE_OTHER): Payer: BC Managed Care – PPO | Admitting: Internal Medicine

## 2011-06-10 VITALS — BP 120/78 | HR 60 | Ht 67.0 in | Wt 168.2 lb

## 2011-06-10 DIAGNOSIS — Z86010 Personal history of colonic polyps: Secondary | ICD-10-CM

## 2011-06-10 DIAGNOSIS — Z1211 Encounter for screening for malignant neoplasm of colon: Secondary | ICD-10-CM

## 2011-06-10 MED ORDER — PEG-KCL-NACL-NASULF-NA ASC-C 100 G PO SOLR: 1.0000 | Freq: Once | ORAL | Status: DC

## 2011-06-10 NOTE — Patient Instructions (Signed)
You have been scheduled for a colonoscopy. Please follow written instructions given to you at your visit today.  Please pick up your prep kit at the pharmacy within the next 2-3 days. 

## 2011-06-10 NOTE — Progress Notes (Signed)
HISTORY OF PRESENT ILLNESS:  Adam Gaines is a 66 y.o. male with the below listed medical history. He presents today regarding surveillance colonoscopy. Previous colonoscopies were with Dr. Terrial Rhodes in 1997 (diminutive polyps destroyed and AVMs), 2002 (normal), and February 2008 (diminutive A. Ascending adenoma removed, diverticulosis, and AVM). Patient currently denies any active GI symptoms. No family history of colon cancer. No problems with previous procedures. REVIEW OF SYSTEMS:  All non-GI ROS negative.  Past Medical History  Diagnosis Date  . Personal history of other disorder of urinary system   . Lumbago   . Chronic eczema     of the plantar aspect right foot  . Chickenpox   . Other and unspecified hyperlipidemia   . Colon polyps     Tubular Adenoma 2008  . Diverticulosis   . Intestinal angiodysplasia     Past Surgical History  Procedure Date  . Repair of deviated septum   . Left l5 hemilaminotomy with microdiskectomy   . Tonsillectomy   . Appendectomy     Social History Adam Gaines  reports that he quit smoking about 32 years ago. He has never used smokeless tobacco. He reports that he drinks about one ounce of alcohol per week. He reports that he does not use illicit drugs.  family history includes COPD in his mother; Coronary artery disease in his father and mother; Diabetes in his maternal grandmother and paternal grandmother; Heart attack in his father and mother; Heart disease in his father and mother; Hyperlipidemia in his father; Hypertension in his father; and Other in his other.  No Known Allergies     PHYSICAL EXAMINATION: Vital signs: BP 120/78  Pulse 60  Ht 5\' 7"  (1.702 m)  Wt 168 lb 3.2 oz (76.295 kg)  BMI 26.34 kg/m2  Constitutional: generally well-appearing, no acute distress Psychiatric: alert and oriented x3, cooperative Eyes: extraocular movements intact, anicteric, conjunctiva pink Mouth: oral pharynx moist, no lesions Neck:  supple no lymphadenopathy Cardiovascular: heart regular rate and rhythm, no murmur Lungs: clear to auscultation bilaterally Abdomen: soft, nontender, nondistended, no obvious ascites, no peritoneal signs, normal bowel sounds, no organomegaly Rectal:deferred until colonoscopy Extremities: no lower extremity edema bilaterally Skin: no lesions on visible extremities Neuro: No focal deficits.  ASSESSMENT:  #1. Personal history of adenomatous colon polyps. Last exam February 2008. Due for followup. No active symptoms. No contraindications    PLAN:  #1. Surveillance colonoscopy. Movi prep prescribed. CRNA monitor propofol sedation recommended. Patient advised.The nature of the procedure, as well as the risks, benefits, and alternatives were carefully and thoroughly reviewed with the patient. Ample time for discussion and questions allowed. The patient understood, was satisfied, and agreed to proceed.

## 2011-06-12 ENCOUNTER — Other Ambulatory Visit: Payer: Self-pay | Admitting: Orthopedic Surgery

## 2011-06-26 ENCOUNTER — Ambulatory Visit (HOSPITAL_BASED_OUTPATIENT_CLINIC_OR_DEPARTMENT_OTHER)
Admission: RE | Admit: 2011-06-26 | Payer: BC Managed Care – PPO | Source: Ambulatory Visit | Admitting: Orthopedic Surgery

## 2011-06-26 ENCOUNTER — Encounter (HOSPITAL_BASED_OUTPATIENT_CLINIC_OR_DEPARTMENT_OTHER): Admission: RE | Payer: Self-pay | Source: Ambulatory Visit

## 2011-06-26 SURGERY — EXCISION MASS
Anesthesia: Regional | Laterality: Right

## 2011-07-01 ENCOUNTER — Other Ambulatory Visit: Payer: Self-pay

## 2011-07-01 DIAGNOSIS — Z86010 Personal history of colonic polyps: Secondary | ICD-10-CM

## 2011-07-01 MED ORDER — PEG-KCL-NACL-NASULF-NA ASC-C 100 G PO SOLR: 1.0000 | Freq: Once | ORAL | Status: DC

## 2011-07-02 ENCOUNTER — Telehealth: Payer: Self-pay

## 2011-07-02 ENCOUNTER — Encounter: Payer: BC Managed Care – PPO | Admitting: Internal Medicine

## 2011-07-02 NOTE — Telephone Encounter (Signed)
Called pharmacy to make sure moviprep rx had been received.  Called patient and verified he had picked it up.  Patient confirmed that he had his medication

## 2011-07-09 ENCOUNTER — Encounter: Payer: BC Managed Care – PPO | Admitting: Internal Medicine

## 2011-07-09 ENCOUNTER — Ambulatory Visit (AMBULATORY_SURGERY_CENTER): Payer: BC Managed Care – PPO | Admitting: Internal Medicine

## 2011-07-09 ENCOUNTER — Encounter: Payer: Self-pay | Admitting: Internal Medicine

## 2011-07-09 VITALS — BP 137/90 | HR 78 | Temp 95.4°F | Resp 20

## 2011-07-09 DIAGNOSIS — Z86010 Personal history of colonic polyps: Secondary | ICD-10-CM

## 2011-07-09 DIAGNOSIS — D126 Benign neoplasm of colon, unspecified: Secondary | ICD-10-CM

## 2011-07-09 DIAGNOSIS — K5730 Diverticulosis of large intestine without perforation or abscess without bleeding: Secondary | ICD-10-CM

## 2011-07-09 DIAGNOSIS — Z1211 Encounter for screening for malignant neoplasm of colon: Secondary | ICD-10-CM

## 2011-07-09 MED ORDER — SODIUM CHLORIDE 0.9 % IV SOLN: 500.0000 mL | INTRAVENOUS | Status: DC

## 2011-07-09 NOTE — Patient Instructions (Signed)
See the picture page for your findings from your exam today.  Follow the green and blue discharge instruction sheets the rest of the day.  Resume your prior medications today. Please call if any questions or concerns.  

## 2011-07-09 NOTE — Op Note (Signed)
[facility] [address] [place], [place]  [address]  COLONOSCOPY PROCEDURE REPORT  PATIENT:  Adam Gaines, Adam Gaines  MR#:  [id number] BIRTHDATE:  06-09-1945, 65 yrs. old  GENDER:  male ENDOSCOPIST:  Wilhemina Bonito. Eda Keys, MD REF. BY:  Surveillance Program Recall, PROCEDURE DATE:  07/09/2011 PROCEDURE:  Colonoscopy with snare polypectomy x 3 ASA CLASS:  Class II INDICATIONS:  history of pre-cancerous (adenomatous) colon polyps, surveillance and high-risk screening ; prior exams 1997, 2002, 2008 (TA, AVM, diverticulosis) MEDICATIONS:   MAC sedation, administered by CRNA, propofol (Diprivan) 360 mg IV  DESCRIPTION OF PROCEDURE:   After the risks benefits and alternatives of the procedure were thoroughly explained, informed consent was obtained.  Digital rectal exam was performed and revealed no abnormalities.   The LB 180AL [id number] endoscope was introduced through the anus and advanced to the cecum, which was identified by both the appendix and ileocecal valve, without limitations.  The quality of the prep was excellent, using MoviPrep.  The instrument was then slowly withdrawn as the colon was fully examined. <<PROCEDUREIMAGES>>  FINDINGS:  Three polyps, < 5mm, were found in the cecum, ascending colon, and sigmoid colon. Polyps were snared without cautery. Retrieval was successful.  Moderate diverticulosis was found in the sigmoid colon.  Otherwise normal colonoscopy without other polyps, masses, vascular ectasias, or inflammatory changes. Retroflexed views in the rectum revealed small internal hemorrhoids. The time to cecum =  4:38  minutes. The scope was then withdrawn in  13:16  minutes from the cecum and the procedure completed.  COMPLICATIONS:  None  ENDOSCOPIC IMPRESSION: 1) Three small  polyps - removed 2) Moderate diverticulosis in the sigmoid colon 3) Otherwise normal colonoscopy  RECOMMENDATIONS: 1) Follow up colonoscopy in 5  years  ______________________________ Wilhemina Bonito. Eda Keys, MD  CC:  Jacques Navy, MD;  The Patient  n. eSIGNED:   Wilhemina Bonito. Eda Keys at 07/09/2011 10:49 AM  Gwynneth Macleod, [phone]

## 2011-07-09 NOTE — Progress Notes (Signed)
No complaints noted in the recovery room. Maw   

## 2011-07-09 NOTE — Progress Notes (Signed)
Propofol per j nulty crna. See scanned intra procedure report. ewm 

## 2011-07-09 NOTE — Progress Notes (Signed)
Patient did not experience any of the following events: a burn prior to discharge; a fall within the facility; wrong site/side/patient/procedure/implant event; or a hospital transfer or hospital admission upon discharge from the facility. (G8907)Patient did not have preoperative order for IV antibiotic SSI prophylaxis. (G8918)    No complaints noted in the recovery room. Maw   

## 2011-07-10 ENCOUNTER — Telehealth: Payer: Self-pay

## 2011-07-10 NOTE — Telephone Encounter (Signed)
Left a message on the pt's answering machine to call if any questions or concerns.  Called [phone]. maw

## 2011-07-12 ENCOUNTER — Other Ambulatory Visit (HOSPITAL_COMMUNITY): Payer: Self-pay | Admitting: Internal Medicine

## 2011-07-15 ENCOUNTER — Encounter: Payer: Self-pay | Admitting: Internal Medicine

## 2011-07-23 ENCOUNTER — Other Ambulatory Visit (HOSPITAL_COMMUNITY): Payer: Self-pay | Admitting: Internal Medicine

## 2011-10-11 ENCOUNTER — Telehealth: Payer: Self-pay | Admitting: Internal Medicine

## 2011-10-11 NOTE — Telephone Encounter (Signed)
The pt called and is hoping to get a referral to a neurologist.  He states his hands have been trembling and a friend suggested a neurologist appointment.  I'll be happy to call back and schedule an ov if you prefer!  Thanks!   [phone]

## 2011-10-12 NOTE — Telephone Encounter (Signed)
Thanks for the note. Please add him to Monday PM or Tuesday AM - eval prior to referral.

## 2011-10-14 NOTE — Telephone Encounter (Signed)
lvmom for pt to call for appt

## 2011-11-18 ENCOUNTER — Encounter: Payer: Self-pay | Admitting: Internal Medicine

## 2011-11-18 ENCOUNTER — Ambulatory Visit (INDEPENDENT_AMBULATORY_CARE_PROVIDER_SITE_OTHER): Payer: BC Managed Care – PPO | Admitting: Internal Medicine

## 2011-11-18 VITALS — BP 116/82 | HR 72 | Temp 98.3°F | Resp 16 | Ht 67.0 in | Wt 169.0 lb

## 2011-11-18 DIAGNOSIS — D126 Benign neoplasm of colon, unspecified: Secondary | ICD-10-CM

## 2011-11-18 DIAGNOSIS — G252 Other specified forms of tremor: Secondary | ICD-10-CM

## 2011-11-18 DIAGNOSIS — G250 Essential tremor: Secondary | ICD-10-CM

## 2011-11-18 DIAGNOSIS — K635 Polyp of colon: Secondary | ICD-10-CM

## 2011-11-18 HISTORY — DX: Essential tremor: G25.0

## 2011-11-18 NOTE — Assessment & Plan Note (Signed)
History and exam c/w diagnosis. No evidence or signs of CNS problem otherwise, specifically no evidence to suggest parkinson's.  Plan Education and referral to MedLinePlus.gov

## 2011-11-18 NOTE — Progress Notes (Signed)
Subjective:    Patient ID: Adam Gaines, male    DOB: 26-Dec-1945, 66 y.o.   MRN: [id number]  HPI Adam Gaines present for evaluation of a fine tremor that he has noticed when he uses a spoon. He has noticed a slight change in his handwriting. He is not dropping things or noticed any trouble with gait or balance.  He has had colonoscopy in Feb - adenomatous polyps. He has noticed a change in Bowel habit that is totally relieved with miralax.  He is having chronic pain at the medial right epicondyle - treating with NSAIDs.  Past Medical History  Diagnosis Date  . Personal history of other disorder of urinary system   . Lumbago   . Chronic eczema     of the plantar aspect right foot  . Chickenpox   . Other and unspecified hyperlipidemia   . Colon polyps     Tubular Adenoma 2008  . Diverticulosis   . Intestinal angiodysplasia    Past Surgical History  Procedure Date  . Repair of deviated septum   . Left l5 hemilaminotomy with microdiskectomy   . Tonsillectomy   . Appendectomy    Family History  Problem Relation Age of Onset  . Heart attack Mother   . Coronary artery disease Mother   . Heart disease Mother     MI, after aspiration  . COPD Mother     smoker  . Coronary artery disease Father   . Heart attack Father     fatal  . Heart disease Father   . Hyperlipidemia Father   . Hypertension Father   . Diabetes Maternal Grandmother   . Other Other     cardiovascular disease  . Diabetes Paternal Grandmother    History   Social History  . Marital Status: Married    Spouse Name: N/A    Number of Children: 2  . Years of Education: 68   Occupational History  . law firm    Social History Main Topics  . Smoking status: Former Smoker    Quit date: 11/28/1978  . Smokeless tobacco: Never Used  . Alcohol Use: 1.0 oz/week    2 drink(s) per week  . Drug Use: No  . Sexually Active: Yes -- Male partner(s)   Other Topics Concern  . Not on file   Social History  Narrative   HSG, [employer], [place] JD '70. 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 Marriage '67 and  in good health. 2 dtrs - '69, '72; 6 grandchildren. Work - Designer, jewellery.    Current Outpatient Prescriptions on File Prior to Visit  Medication Sig Dispense Refill  . aspirin 81 MG tablet Take 81 mg by mouth daily.        Marland Kitchen atorvastatin (LIPITOR) 20 MG tablet TAKE ONE TABLET ONCE DAILY  90 tablet  1  . naproxen sodium (ANAPROX) 220 MG tablet Take 220 mg by mouth 2 (two) times daily with a meal.            Review of Systems System review is negative for any constitutional, cardiac, pulmonary, GI or neuro symptoms or complaints other than as described in the HPI.     Objective:   Physical Exam Filed Vitals:   11/18/11 0928  BP: 116/82  Pulse: 72  Temp: 98.3 F (36.8 C)  Resp: 16   Gen';l WNWD white man in no distress Cor- RRR Pulm normal respirations Neuro - A&O x 3, CN II-XII normal,  DTRs normal at biceps and radial tendon. Normal rapid alternating finger movement, no cogwheeling rigidity. With extended hands he has a very fine tremor bilaterally but more noticeable on the rigth. MSK - tenderness at the medial epicondyle right.      Assessment & Plan:  Epicondylitis - right medial - moderate.  Plan  aspercreme  Rest and stretch.

## 2011-11-18 NOTE — Assessment & Plan Note (Signed)
To regulate bowel - ok to use miralax prn or daily

## 2011-11-18 NOTE — Patient Instructions (Addendum)
Tremor - a benign essential tremor by history and exam. This may or may not progress. There is no indication of a central nervous system problem. No treatment is indicated at this time. For more information: [email or web address]  Bowel habit - it is perfectly safe to take miralax on a daily or as needed basis.

## 2012-01-06 ENCOUNTER — Other Ambulatory Visit (HOSPITAL_COMMUNITY): Payer: Self-pay | Admitting: *Deleted

## 2012-01-06 MED ORDER — ATORVASTATIN CALCIUM 20 MG PO TABS: 20.0000 mg | ORAL_TABLET | Freq: Every day | ORAL | Status: DC

## 2012-03-06 ENCOUNTER — Ambulatory Visit (INDEPENDENT_AMBULATORY_CARE_PROVIDER_SITE_OTHER): Payer: BC Managed Care – PPO | Admitting: Cardiology

## 2012-03-06 ENCOUNTER — Encounter: Payer: Self-pay | Admitting: Cardiology

## 2012-03-06 VITALS — BP 138/82 | HR 70 | Ht 67.0 in | Wt 166.2 lb

## 2012-03-06 DIAGNOSIS — Z9189 Other specified personal risk factors, not elsewhere classified: Secondary | ICD-10-CM

## 2012-03-06 DIAGNOSIS — E7800 Pure hypercholesterolemia, unspecified: Secondary | ICD-10-CM

## 2012-03-06 DIAGNOSIS — E785 Hyperlipidemia, unspecified: Secondary | ICD-10-CM

## 2012-03-06 NOTE — Patient Instructions (Addendum)
Your physician wants you to follow-up in:  12 months. You will receive a reminder letter in the mail two months in advance. If you don't receive a letter, please call our office to schedule the follow-up appointment.  Your physician recommends that you have fasting lab work done at [facility]. [place] office next week.   Non-Cardiac CT scanning, (CAT scanning), is a noninvasive, special x-ray that produces cross-sectional images of the body using x-rays and a computer. CT scans help physicians diagnose and treat medical conditions. For some CT exams, a contrast material is used to enhance visibility in the area of the body being studied. CT scans provide greater clarity and reveal more details than regular x-ray exams.

## 2012-03-08 DIAGNOSIS — Z9189 Other specified personal risk factors, not elsewhere classified: Secondary | ICD-10-CM | POA: Insufficient documentation

## 2012-03-08 HISTORY — DX: Other specified personal risk factors, not elsewhere classified: Z91.89

## 2012-03-08 NOTE — Progress Notes (Signed)
Patient ID: Adam Gaines, male   DOB: 12/29/1945, 66 y.o.   MRN: [id number] PCP: Dr. Debby Bud  66 yo with hyperlipidemia and family history of CAD presents for cardiology followup.  He has been seen by Dr. Gala Romney in the past and is seen by me for the first time today.  He was started on a statin for his hyperlipidemia.  He had a normal ETT in 11/09.  Since his last appointment here, he has been doing well.  He still Ecologist.  He is active with golfing, treadmill, and Pilates.  He watches his diet.  No chest pain or tightness.  No exertional dyspnea.  No presyncope/syncope.    Labs (6/12): LDL 85, HDL 81  ECG: NSR, normal  PMH: 1. Hyperlipidemia 2. ETT (11/09): No evidence for ischemia or infarction, good exercise tolerance.  3. Essential tremor 4. Osteoarthritis 5. Diverticulosis 6. Back surgery 2/2 ruptured disc.   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   FH: Father with CVA at 30 (smoked).  Mother with MI at 92 (smoked).    ROS: All systems reviewed and negative except as per HPI.   Current Outpatient Prescriptions  Medication Sig Dispense Refill  . aspirin 81 MG tablet Take 81 mg by mouth daily.        Marland Kitchen atorvastatin (LIPITOR) 20 MG tablet Take 1 tablet (20 mg total) by mouth daily.  90 tablet  1  . naproxen sodium (ANAPROX) 220 MG tablet Take 220 mg by mouth 2 (two) times daily with a meal.          BP 138/82  Pulse 70  Ht 5\' 7"  (1.702 m)  Wt 166 lb 4 oz (75.411 kg)  BMI 26.04 kg/m2 General: NAD Neck: No JVD, no thyromegaly or thyroid nodule.  Lungs: Clear to auscultation bilaterally with normal respiratory effort. CV: Nondisplaced PMI.  Heart regular S1/S2, no S3/S4, no murmur.  No peripheral edema.  No carotid bruit.  Normal pedal pulses.  Abdomen: Soft, nontender, no hepatosplenomegaly, no distention.  Skin: Intact without lesions or rashes.  Neurologic: Alert and oriented x 3.  Psych: Normal affect. Extremities: No clubbing or  cyanosis.   Assessment/Plan: 1. CAD risk: Patient is doing well in general.  He does have a family history of CAD.  He asks if there would be any reason to intensify his statin as his brother takes a higher dose.  I will have him continue ASA 81 and current statin dose for now.  I will get a coronary calcium score.  If this is above average, I may adjust this statin.   2. Hyperlipidemia: I will get lipids/LFTs today.   Kaisa Wofford [employer]

## 2012-03-10 ENCOUNTER — Other Ambulatory Visit (INDEPENDENT_AMBULATORY_CARE_PROVIDER_SITE_OTHER): Payer: BC Managed Care – PPO

## 2012-03-10 DIAGNOSIS — E7800 Pure hypercholesterolemia, unspecified: Secondary | ICD-10-CM

## 2012-03-10 LAB — HEPATIC FUNCTION PANEL
ALT: 34 U/L (ref 0–53)
Alkaline Phosphatase: 42 U/L (ref 39–117)
Bilirubin, Direct: 0.1 mg/dL (ref 0.0–0.3)
Total Bilirubin: 0.9 mg/dL (ref 0.3–1.2)
Total Protein: 6.7 g/dL (ref 6.0–8.3)

## 2012-03-10 LAB — LIPID PANEL: Cholesterol: 171 mg/dL (ref 0–200)

## 2012-03-10 LAB — BASIC METABOLIC PANEL
BUN: 15 mg/dL (ref 6–23)
Creatinine, Ser: 0.9 mg/dL (ref 0.4–1.5)
GFR: 90.23 mL/min (ref >60.00)
Glucose, Bld: 91 mg/dL (ref 70–99)

## 2012-03-11 ENCOUNTER — Ambulatory Visit (INDEPENDENT_AMBULATORY_CARE_PROVIDER_SITE_OTHER)
Admission: RE | Admit: 2012-03-11 | Discharge: 2012-03-11 | Disposition: A | Discharge status: Home / Self Care | Payer: Self-pay | Source: Ambulatory Visit | Attending: Cardiology | Admitting: Cardiology

## 2012-03-11 ENCOUNTER — Telehealth: Payer: Self-pay | Admitting: Cardiology

## 2012-03-11 DIAGNOSIS — E7800 Pure hypercholesterolemia, unspecified: Secondary | ICD-10-CM

## 2012-03-11 NOTE — Telephone Encounter (Signed)
Pt.notified

## 2012-03-11 NOTE — Telephone Encounter (Signed)
New problem    Returning call back to nurse.   

## 2012-07-18 ENCOUNTER — Other Ambulatory Visit: Payer: Self-pay

## 2012-07-21 ENCOUNTER — Other Ambulatory Visit: Payer: Self-pay | Admitting: *Deleted

## 2012-07-21 MED ORDER — ATORVASTATIN CALCIUM 20 MG PO TABS: 20.0000 mg | ORAL_TABLET | Freq: Every day | ORAL | Status: DC

## 2012-07-28 ENCOUNTER — Other Ambulatory Visit: Payer: Self-pay | Admitting: *Deleted

## 2012-07-28 MED ORDER — ATORVASTATIN CALCIUM 20 MG PO TABS: 20.0000 mg | ORAL_TABLET | Freq: Every day | ORAL | Status: DC

## 2012-08-31 ENCOUNTER — Encounter: Payer: Self-pay | Admitting: Internal Medicine

## 2012-08-31 ENCOUNTER — Ambulatory Visit (INDEPENDENT_AMBULATORY_CARE_PROVIDER_SITE_OTHER): Payer: BC Managed Care – PPO | Admitting: Internal Medicine

## 2012-08-31 VITALS — BP 124/70 | HR 59 | Temp 97.6°F | Resp 16 | Ht 67.0 in | Wt 163.0 lb

## 2012-08-31 DIAGNOSIS — Z0000 Encounter for general adult medical examination without abnormal findings: Secondary | ICD-10-CM

## 2012-08-31 DIAGNOSIS — E785 Hyperlipidemia, unspecified: Secondary | ICD-10-CM

## 2012-08-31 DIAGNOSIS — Z9189 Other specified personal risk factors, not elsewhere classified: Secondary | ICD-10-CM

## 2012-08-31 NOTE — Progress Notes (Signed)
Subjective:    Patient ID: Adam Gaines, male    DOB: Jul 05, 1945, 67 y.o.   MRN: [id number]  HPI Mr. Staiger is here for annual  wellness examination and management of other chronic and acute problems.   The risk factors are reflected in the social history.  The roster of all physicians providing medical care to patient - is listed in the Snapshot section of the chart.  Activities of daily living:  The patient is 100% inedpendent in all ADLs: dressing, toileting, feeding as well as independent mobility  Home safety : The patient has smoke detectors in the home. Falls - no. Home is fall safe.  They wear seatbelts. No firearms at home. There is no violence in the home.   There is no risks for hepatitis, STDs or HIV. There is no   history of blood transfusion. They have no travel history to infectious disease endemic areas of the world.  The patient has seen their dentist in the last six month. They have seen their eye doctor in the last year. They deny any hearing difficulty and have not had audiologic testing in the last year.    They do not  have excessive sun exposure. Discussed the need for sun protection: hats, long sleeves and use of sunscreen if there is significant sun exposure.   Diet: the importance of a healthy diet is discussed. They do have a healthy diet.  The patient has a regular exercise program: paladies, treadmill/stretch ,  45 min duration,  2 per week.  The benefits of regular aerobic exercise were discussed.  Depression screen: there are no signs or vegative symptoms of depression- irritability, change in appetite, anhedonia, sadness/tearfullness.  Cognitive assessment: the patient manages all their financial and personal affairs and is actively engaged.   The following portions of the patient's history were reviewed and updated as appropriate: allergies, current medications, past family history, past medical history,  past surgical history, past social history  and  problem list.  Vision, hearing, body mass index were assessed and reviewed.   During the course of the visit the patient was educated and counseled about appropriate screening and preventive services including : fall prevention , diabetes screening, nutrition counseling, colorectal cancer screening, and recommended immunizations.  Past Medical History  Diagnosis Date  . Personal history of other disorder of urinary system   . Lumbago   . Chronic eczema     of the plantar aspect right foot  . Chickenpox   . Other and unspecified hyperlipidemia   . Colon polyps     Tubular Adenoma 2008  . Diverticulosis   . Intestinal angiodysplasia    Past Surgical History  Procedure Laterality Date  . Repair of deviated septum    . Left l5 hemilaminotomy with microdiskectomy    . Tonsillectomy    . Appendectomy     Family History  Problem Relation Age of Onset  . Heart attack Mother   . Coronary artery disease Mother   . Heart disease Mother     MI, after aspiration  . COPD Mother     smoker  . Coronary artery disease Father   . Heart attack Father     fatal  . Heart disease Father   . Hyperlipidemia Father   . Hypertension Father   . Diabetes Maternal Grandmother   . Other Other     cardiovascular disease  . Diabetes Paternal Grandmother    History   Social History  . Marital  Status: Married    Spouse Name: N/A    Number of Children: 2  . Years of Education: 65   Occupational History  . law firm    Social History Main Topics  . Smoking status: Former Smoker    Quit date: 11/28/1978  . Smokeless tobacco: Never Used  . Alcohol Use: 1.0 oz/week    2 drink(s) per week  . Drug Use: No  . Sexually Active: Yes -- Male partner(s)   Other Topics Concern  . 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 Marriage '67 and  in good health. 2 dtrs - '69, '72; 6 grandchildren. Work - Armed forces logistics/support/administrative officer.                         Current Outpatient Prescriptions on File Prior to Visit  Medication Sig Dispense Refill  . aspirin 81 MG tablet Take 81 mg by mouth daily.        Marland Kitchen atorvastatin (LIPITOR) 20 MG tablet Take 1 tablet (20 mg total) by mouth daily.  90 tablet  2   No current facility-administered medications on file prior to visit.       Review of Systems Constitutional:  Negative for fever, chills, activity change and unexpected weight change.  HEENT:  Negative for hearing loss, ear pain, congestion, neck stiffness and postnasal drip. Negative for sore throat or swallowing problems. Negative for dental complaints.   Eyes: Negative for vision loss or change in visual acuity.  Respiratory: Negative for chest tightness and wheezing. Negative for DOE.   Cardiovascular: Negative for chest pain or palpitations. No decreased exercise tolerance Gastrointestinal: No change in bowel habit. No bloating or gas. No reflux or indigestion Genitourinary: Negative for urgency, frequency, flank pain and difficulty urinating.  Musculoskeletal: Negative for myalgias, back pain, arthralgias and gait problem.  Neurological: Negative for dizziness, tremors, weakness and headaches.  Hematological: Negative for adenopathy.  Psychiatric/Behavioral: Negative for behavioral problems and dysphoric mood.       Objective:   Physical Exam Filed Vitals:   08/31/12 1333  BP: 124/70  Pulse: 59  Temp: 97.6 F (36.4 C)  Resp: 16   Wt Readings from Last 3 Encounters:  08/31/12 163 lb (73.936 kg)  03/06/12 166 lb 4 oz (75.411 kg)  11/18/11 169 lb (76.658 kg)   Gen'l: Well nourished well developed male in no acute distress  HEENT: Head: Normocephalic and atraumatic. Right Ear: External ear normal. EAC/TM nl. Left Ear: External ear normal.  EAC/TM nl. Nose: Nose normal. Mouth/Throat: Oropharynx is clear and moist. Dentition - native, in good repair. No buccal or palatal lesions. Posterior pharynx  clear. Eyes: Conjunctivae and sclera clear. EOM intact. Pupils are equal, round, and reactive to light. Right eye exhibits no discharge. Left eye exhibits no discharge. Neck: Normal range of motion. Neck supple. No JVD present. No tracheal deviation present. No thyromegaly present.  Cardiovascular: Normal rate, regular rhythm, no gallop, no friction rub, no murmur heard.      Quiet precordium. 2+ radial and DP pulses . No carotid bruits Pulmonary/Chest: Effort normal. No respiratory distress or increased WOB, no wheezes, no rales. No chest wall deformity or CVAT. Abdomen: Soft. Bowel sounds are normal in all quadrants. He exhibits no distension, no tenderness, no rebound or guarding, No heptosplenomegaly  Genitourinary:  deferred Musculoskeletal: Normal range of motion. He exhibits  no edema and no tenderness.       Small and large joints without redness, synovial thickening or deformity. Full range of motion preserved about all small, median and large joints.  Lymphadenopathy:    He has no cervical or supraclavicular adenopathy.  Neurological: He is alert and oriented to person, place, and time. CN II-XII intact. DTRs 2+ and symmetrical biceps, radial and patellar tendons. Cerebellar function normal with no tremor, rigidity, normal gait and station.  Skin: Skin is warm and dry. No rash noted. No erythema.  Psychiatric: He has a normal mood and affect. His behavior is normal. Thought content normal.   Lab Results  Component Value Date   WBC 5.1 11/26/2010   HGB 14.1 11/26/2010   HCT 41.1 11/26/2010   PLT 170.0 11/26/2010   GLUCOSE 91 03/10/2012   CHOL 171 03/10/2012   TRIG 66.0 03/10/2012   HDL 79.60 03/10/2012   LDLCALC 78 03/10/2012   ALT 34 03/10/2012   AST 33 03/10/2012   NA 139 03/10/2012   K 4.2 03/10/2012   CL 105 03/10/2012   CREATININE 0.9 03/10/2012   BUN 15 03/10/2012   CO2 28 03/10/2012   TSH 1.08 11/26/2010   PSA 1.14 11/26/2010           Assessment & Plan:

## 2012-09-01 NOTE — Assessment & Plan Note (Signed)
Last lab six months ago with excellent control: HDL 79.6, LDL 78 giving an LDL/HDL = 1. NCEP/Framingham cardiac risk score = 8% risk of cardiac event in 10 year. CT calcium scoring = 0  Plan Continue present medication  Continue health life-style

## 2012-09-01 NOTE — Assessment & Plan Note (Signed)
LDL/HDL ratio = 1; CT cardiac calcium scoring = 0; Framingham/NCEP cardiac risk = 8% risk of cardiac event in the next 10 years, LOW risk.  Plan Continue excellent cardiac risk reduction and control.

## 2012-09-01 NOTE — Assessment & Plan Note (Signed)
Interval medical history is benign with no serious medical illness, surgery or injury. Physical exam is normal. Labs of 6 months ago reviewed - all in normal range. He is current with colorectal cancer screening. Discussed pros and cons of prostate cancer screening (USPHCTF recommendations reviewed and ACU April '13 recommendations) and he defers  evaluation at this time with PSAs '08 0.83, '09 0.87, '10 0.87, '11 1.02 and '12 1.14. At age 67 with the last 5 PSA readings being normal no further screening is indicated. Immunizations are complete and up to date.  In summary - a very nice man who is medically healthy and stable. He is encouraged to increase his pilates sessions and to consider exercise as a responsibility, not a leisure time activity. He will return in 1 year or sooner as needed.

## 2013-03-04 ENCOUNTER — Encounter: Payer: Self-pay | Admitting: *Deleted

## 2013-03-10 ENCOUNTER — Encounter: Payer: Self-pay | Admitting: Cardiology

## 2013-03-10 ENCOUNTER — Ambulatory Visit (INDEPENDENT_AMBULATORY_CARE_PROVIDER_SITE_OTHER): Payer: BC Managed Care – PPO | Admitting: Cardiology

## 2013-03-10 VITALS — BP 110/70 | HR 63 | Ht 67.0 in | Wt 165.8 lb

## 2013-03-10 DIAGNOSIS — Z8249 Family history of ischemic heart disease and other diseases of the circulatory system: Secondary | ICD-10-CM

## 2013-03-10 DIAGNOSIS — E785 Hyperlipidemia, unspecified: Secondary | ICD-10-CM

## 2013-03-10 DIAGNOSIS — Z9189 Other specified personal risk factors, not elsewhere classified: Secondary | ICD-10-CM

## 2013-03-10 MED ORDER — ATORVASTATIN CALCIUM 20 MG PO TABS: 20.0000 mg | ORAL_TABLET | Freq: Every day | ORAL | Status: DC

## 2013-03-10 NOTE — Patient Instructions (Signed)
Your physician recommends that you return for a FASTING lipid profile/liver profile/BMET/CBCd/PSA.  Your physician wants you to follow-up in: 1 year with Dr Shirlee Latch. (October 2015).  You will receive a reminder letter in the mail two months in advance. If you don't receive a letter, please call our office to schedule the follow-up appointment.

## 2013-03-11 ENCOUNTER — Other Ambulatory Visit: Payer: Self-pay | Admitting: *Deleted

## 2013-03-11 ENCOUNTER — Other Ambulatory Visit: Payer: BC Managed Care – PPO

## 2013-03-11 DIAGNOSIS — E785 Hyperlipidemia, unspecified: Secondary | ICD-10-CM

## 2013-03-11 DIAGNOSIS — Z9189 Other specified personal risk factors, not elsewhere classified: Secondary | ICD-10-CM

## 2013-03-11 NOTE — Progress Notes (Signed)
Patient ID: Adam Gaines, male   DOB: 1946/05/26, 67 y.o.   MRN: [id number] PCP: Dr. Debby Bud  67 yo with hyperlipidemia and family history of CAD presents for cardiology followup.  He had a normal ETT in 11/09.  In 10/13, I had him do a noncontrast CT for coronary calcium scoring.  The coronary calcium score was 0.  Since his last appointment here, he has been doing well.  He still Ecologist.  He is active with golfing, treadmill (30 minutes at a time), and Pilates.  He watches his diet.  No chest pain or tightness.  No exertional dyspnea.  No presyncope/syncope.    Labs (6/12): LDL 85, HDL 81 Labs (10/13): LDL 78, HDL 80  ECG: NSR, normal  PMH: 1. Hyperlipidemia 2. CAD risk: ETT (11/09): No evidence for ischemia or infarction, good exercise tolerance.  Coronary artery calcium score 0 Agatston units in 10/13.  3. Essential tremor 4. Osteoarthritis 5. Diverticulosis 6. Back surgery 2/2 ruptured disc.   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   FH: Father with CVA at 69 (smoked).  Mother with MI at 69 (smoked).    Current Outpatient Prescriptions  Medication Sig Dispense Refill  . aspirin 81 MG tablet Take 81 mg by mouth daily.        Marland Kitchen atorvastatin (LIPITOR) 20 MG tablet Take 1 tablet (20 mg total) by mouth daily.  90 tablet  3   No current facility-administered medications for this visit.    BP 110/70  Pulse 63  Ht 5\' 7"  (1.702 m)  Wt 75.206 kg (165 lb 12.8 oz)  BMI 25.96 kg/m2 General: NAD Neck: No JVD, no thyromegaly or thyroid nodule.  Lungs: Clear to auscultation bilaterally with normal respiratory effort. CV: Nondisplaced PMI.  Heart regular S1/S2, no S3/S4, no murmur.  No peripheral edema.  No carotid bruit.  Normal pedal pulses.  Abdomen: Soft, nontender, no hepatosplenomegaly, no distention.  Skin: Intact without lesions or rashes.  Neurologic: Alert and oriented x 3.  Psych: Normal affect. Extremities: No clubbing or cyanosis.    Assessment/Plan: 1. CAD risk: Patient is doing well in general.  He does have a family history of CAD.  He had a coronary artery calcium score of 0 a year ago.  He will continue ASA 81 daily and atorvastatin 20 daily.  No indication for any further testing.  2. Hyperlipidemia: I will get lipids/LFTs today.   I will see him back in a year.  Marca Ancona 03/11/2013

## 2013-03-15 ENCOUNTER — Other Ambulatory Visit (INDEPENDENT_AMBULATORY_CARE_PROVIDER_SITE_OTHER): Payer: BC Managed Care – PPO

## 2013-03-15 DIAGNOSIS — E785 Hyperlipidemia, unspecified: Secondary | ICD-10-CM

## 2013-03-15 DIAGNOSIS — Z9189 Other specified personal risk factors, not elsewhere classified: Secondary | ICD-10-CM

## 2013-03-15 LAB — LIPID PANEL
HDL: 86 mg/dL (ref >39.00)
LDL Cholesterol: 87 mg/dL (ref 0–99)
Total CHOL/HDL Ratio: 2
VLDL: 13.6 mg/dL (ref 0.0–40.0)

## 2013-03-15 LAB — BASIC METABOLIC PANEL
BUN: 22 mg/dL (ref 6–23)
GFR: 86.59 mL/min (ref >60.00)
Glucose, Bld: 99 mg/dL (ref 70–99)
Potassium: 4.7 mEq/L (ref 3.5–5.1)
Sodium: 141 mEq/L (ref 135–145)

## 2013-03-15 LAB — CBC WITH DIFFERENTIAL/PLATELET
Basophils Absolute: 0 10*3/uL (ref 0.0–0.1)
Eosinophils Relative: 1.8 % (ref 0.0–5.0)
HCT: 42.3 % (ref 39.0–52.0)
Lymphocytes Relative: 36.4 % (ref 12.0–46.0)
Lymphs Abs: 2 10*3/uL (ref 0.7–4.0)
MCV: 91.3 fl (ref 78.0–100.0)
Monocytes Absolute: 0.4 10*3/uL (ref 0.1–1.0)
Neutrophils Relative %: 54.4 % (ref 43.0–77.0)
Platelets: 158 10*3/uL (ref 150.0–400.0)
RBC: 4.63 Mil/uL (ref 4.22–5.81)
WBC: 5.5 10*3/uL (ref 4.5–10.5)

## 2013-03-15 LAB — HEPATIC FUNCTION PANEL
ALT: 33 U/L (ref 0–53)
AST: 37 U/L (ref 0–37)
Albumin: 4 g/dL (ref 3.5–5.2)
Alkaline Phosphatase: 38 U/L — ABNORMAL LOW (ref 39–117)
Bilirubin, Direct: 0.1 mg/dL (ref 0.0–0.3)
Total Bilirubin: 0.9 mg/dL (ref 0.3–1.2)

## 2013-03-15 LAB — PSA: PSA: 2.2 ng/mL (ref 0.10–4.00)

## 2013-03-17 ENCOUNTER — Encounter: Payer: Self-pay | Admitting: Internal Medicine

## 2013-04-08 ENCOUNTER — Other Ambulatory Visit: Payer: Self-pay

## 2013-06-28 ENCOUNTER — Encounter: Payer: Self-pay | Admitting: Internal Medicine

## 2013-07-01 ENCOUNTER — Encounter: Payer: Self-pay | Admitting: Internal Medicine

## 2013-07-13 ENCOUNTER — Ambulatory Visit (INDEPENDENT_AMBULATORY_CARE_PROVIDER_SITE_OTHER): Payer: BC Managed Care – PPO | Admitting: *Deleted

## 2013-07-13 DIAGNOSIS — Z23 Encounter for immunization: Secondary | ICD-10-CM

## 2013-09-01 ENCOUNTER — Encounter: Payer: Self-pay | Admitting: Internal Medicine

## 2013-09-01 ENCOUNTER — Ambulatory Visit (INDEPENDENT_AMBULATORY_CARE_PROVIDER_SITE_OTHER): Payer: BC Managed Care – PPO | Admitting: Internal Medicine

## 2013-09-01 VITALS — BP 120/72 | HR 64 | Temp 97.9°F | Ht 67.0 in | Wt 164.8 lb

## 2013-09-01 DIAGNOSIS — Z0000 Encounter for general adult medical examination without abnormal findings: Secondary | ICD-10-CM

## 2013-09-01 DIAGNOSIS — Z23 Encounter for immunization: Secondary | ICD-10-CM

## 2013-09-01 MED ORDER — ATORVASTATIN CALCIUM 20 MG PO TABS: 20.0000 mg | ORAL_TABLET | Freq: Every day | ORAL | Status: DC

## 2013-09-01 NOTE — Progress Notes (Signed)
Pre visit review using our clinic review tool, if applicable. No additional management support is needed unless otherwise documented below in the visit note. 

## 2013-09-01 NOTE — Progress Notes (Signed)
Subjective:    Patient ID: Adam Gaines, male    DOB: 12-04-1945, 68 y.o.   MRN: [id number]  HPI  Transfer to me from [facility] due to retirement - patient is here today for annual physical. Patient feels well and has no complaints.  Also reviewed chronic medical issues and interval medical events  Past Medical History  Diagnosis Date  . Personal history of other disorder of urinary system   . Lumbago   . Chronic eczema     of the plantar aspect right foot  . Chickenpox   . Other and unspecified hyperlipidemia   . Colon polyps     Tubular Adenoma 2008  . Diverticulosis   . Intestinal angiodysplasia   . Benign essential tremor 11/18/2011    Seen for this November 18, 2011   . Cardiovascular risk factor 03/08/2012  . TONSILLECTOMY AND ADENOIDECTOMY, HX OF 07/24/2007    Qualifier: Diagnosis of  By: Genelle Gather CMA, [place]     Family History  Problem Relation Age of Onset  . Heart attack Mother     after aspiration  . Coronary artery disease Mother   . Heart disease Mother   . COPD Mother     smoker  . Coronary artery disease Father   . Heart attack Father     fatal  . Heart disease Father   . Hyperlipidemia Father   . Hypertension Father   . Diabetes Maternal Grandmother   . Other Other     cardiovascular disease  . Diabetes Paternal Grandmother    History  Substance Use Topics  . Smoking status: Former Smoker    Quit date: 11/28/1978  . Smokeless tobacco: Never Used  . Alcohol Use: 1.0 oz/week    2 drink(s) per week    Review of Systems  Constitutional: Negative for fever, activity change, appetite change, fatigue and unexpected weight change.  Respiratory: Negative for cough, chest tightness, shortness of breath and wheezing.   Cardiovascular: Negative for chest pain, palpitations and leg swelling.  Neurological: Negative for dizziness, weakness and headaches.  Psychiatric/Behavioral: Negative for dysphoric mood. The patient is not nervous/anxious.   All other  systems reviewed and are negative.       Objective:   Physical Exam  BP 120/72  Pulse 64  Temp(Src) 97.9 F (36.6 C) (Oral)  Ht 5\' 7"  (1.702 m)  Wt 164 lb 12.8 oz (74.753 kg)  BMI 25.81 kg/m2  SpO2 96% Wt Readings from Last 3 Encounters:  09/01/13 164 lb 12.8 oz (74.753 kg)  03/10/13 165 lb 12.8 oz (75.206 kg)  08/31/12 163 lb (73.936 kg)   Constitutional: he appears well-developed and well-nourished. No distress.  HENT: Head: Normocephalic and atraumatic. Ears: B TMs ok, no erythema or effusion; Nose: Nose normal. Mouth/Throat: Oropharynx is clear and moist. No oropharyngeal exudate.  Eyes: Conjunctivae and EOM are normal. Pupils are equal, round, and reactive to light. No scleral icterus.  Neck: Normal range of motion. Neck supple. No JVD present. No thyromegaly present.  Cardiovascular: Normal rate, regular rhythm and normal heart sounds.  No murmur heard. No BLE edema. Pulmonary/Chest: Effort normal and breath sounds normal. No respiratory distress. he has no wheezes.  Abdominal: Soft. Bowel sounds are normal. he exhibits no distension. There is no tenderness. no masses Musculoskeletal: Normal range of motion, no joint effusions. No gross deformities Neurological: he is alert and oriented to person, place, and time. No cranial nerve deficit. Coordination, balance, strength, speech and gait are normal.  Skin: Skin is warm and dry. No rash noted. No erythema.  Psychiatric: he has a normal mood and affect. behavior is normal. Judgment and thought content normal.   Lab Results  Component Value Date   WBC 5.5 03/15/2013   HGB 14.6 03/15/2013   HCT 42.3 03/15/2013   PLT 158.0 03/15/2013   GLUCOSE 99 03/15/2013   CHOL 187 03/15/2013   TRIG 68.0 03/15/2013   HDL 86.00 03/15/2013   LDLCALC 87 03/15/2013   ALT 33 03/15/2013   AST 37 03/15/2013   NA 141 03/15/2013   K 4.7 03/15/2013   CL 105 03/15/2013   CREATININE 0.9 03/15/2013   BUN 22 03/15/2013   CO2 28 03/15/2013    TSH 1.08 11/26/2010   PSA 2.20 03/15/2013    Ct Cardiac Scoring  03/12/2012   **ADDENDUM** CREATED: 03/12/2012 18:04:37  CARDIAC CTA WITH CALCIUM SCORE 03/11/2012 15:18:00  Ordering Physician: DALTONMCLEAN  Reading Physician: DaltonS.Mclean  Protocol:  The patient scanned on a Siemens 16 slice scanner. After an initial AP and lateral topogram, 3 mm axial slices were performed through the heart for calcium scoring.  Indications: CAD risk factors  DETAILED FINDINGS:  Quality of Study: Good  Coronary Calcium Score: 0  Noncardiac findings to be read by [facility].  IMPRESSION: Coronary artery calcium score of 0, placing the patient in a low risk category for future cardiovascular events.  **END ADDENDUM** SIGNED BY: Dalton S. Mclean  03/11/2012   OVER-READ INTERPRETATION - CT CHEST  The following report is an over-read performed by radiologist Dr. Karn Cassis. Reche Dixon, M.D. of [facility], [place] on 03/11/2012 16:42:08.  This over-read does not include interpretation of cardiac or coronary anatomy or pathology.  The coronary calcium score interpretation by the cardiologist is attached.  Findings: Lung windows demonstrate minimal scarring at the left lung base.  Soft tissue windows demonstrate ascending aortic size upper normal, 3.8 cm on image 1 of series 5.  Incompletely imaged.  No pericardial or pleural effusion.  No imaged thoracic adenopathy within the included chest.  No pericardial or pleural effusion.  Limited abdominal imaging demonstrates right liver lobe lesion which measures 1.0 cm and is relatively well circumscribed and fluid density on image 37.  A sub centimeter low density hepatic dome lesion on image 34 which is also favored to represent a small cyst.  Prominent osteophytes within the thoracic spine.  IMPRESSION  1.  No acute extracardiac findings within the chest. 2.  Incompletely imaged borderline dilated ascending aorta. 3.  2 low density liver lesions. Most likely cysts.  If there is  any history of primary malignancy, recommend correlation with prior cross-sectional imaging and consideration of pre and post contrast abdominal MRI.  Coronary Calcium Score:  Indication: Risk stratification  The patient was scanned on a Siemens Sensation 16 slice scanner. Noncontrast axial slices were done through the heart.  Calicum scoring was done on a dedicated work station using the [employer]   Findings:  Coronary calcium score 0  Upper limits of normal ascending aortic root 3.8 cm  See separate report from Dr Jeronimo Greaves regarding noncardiac findings and possible liver cysts.  Charlton Haws MD [facility]   Original Report Authenticated By: Gretta Began       Assessment & Plan:   CPX/v70.0 - Patient has been counseled on age-appropriate routine health concerns for screening and prevention. These are reviewed and up-to-date. Immunizations are up-to-date or declined. Labs ordered and reviewed.

## 2013-09-01 NOTE — Patient Instructions (Addendum)
It was good to see you today.  We have reviewed your prior records including labs and tests today  Health Maintenance reviewed - Prevnar update today -all other recommended immunizations and age-appropriate screenings are up-to-date.  Test(s) ordered today. Return when you are fasting. Your results will be released to [place] (or called to you) after review, usually within 72hours after test completion. If any changes need to be made, you will be notified at that same time.  Medications reviewed and updated, no changes recommended at this time.  Please schedule followup in 12 months for annual exam and labs, call sooner if problems.  Health Maintenance, Males A healthy lifestyle and preventative care can promote health and wellness.  Maintain regular health, dental, and eye exams.  Eat a healthy diet. Foods like vegetables, fruits, whole grains, low-fat dairy products, and lean protein foods contain the nutrients you need and are low in calories. Decrease your intake of foods high in solid fats, added sugars, and salt. Get information about a proper diet from your health care provider, if necessary.  Regular physical exercise is one of the most important things you can do for your health. Most adults should get at least 150 minutes of moderate-intensity exercise (any activity that increases your heart rate and causes you to sweat) each week. In addition, most adults need muscle-strengthening exercises on 2 or more days a week.   Maintain a healthy weight. The body mass index (BMI) is a screening tool to identify possible weight problems. It provides an estimate of body fat based on height and weight. Your health care provider can find your BMI and can help you achieve or maintain a healthy weight. For males 20 years and older:  A BMI below 18.5 is considered underweight.  A BMI of 18.5 to 24.9 is normal.  A BMI of 25 to 29.9 is considered overweight.  A BMI of 30 and above is considered  obese.  Maintain normal blood lipids and cholesterol by exercising and minimizing your intake of saturated fat. Eat a balanced diet with plenty of fruits and vegetables. Blood tests for lipids and cholesterol should begin at age 81 and be repeated every 5 years. If your lipid or cholesterol levels are high, you are over 50, or you are at high risk for heart disease, you may need your cholesterol levels checked more frequently.Ongoing high lipid and cholesterol levels should be treated with medicines, if diet and exercise are not working.  If you smoke, find out from your health care provider how to quit. If you do not use tobacco, do not start.  Lung cancer screening is recommended for adults aged 65 80 years who are at high risk for developing lung cancer because of a history of smoking. A yearly low-dose CT scan of the lungs is recommended for people who have at least a 30-pack-year history of smoking and are a current smoker or have quit within the past 15 years. A pack year of smoking is smoking an average of 1 pack of cigarettes a day for 1 year (for example, a 30-pack-year history of smoking could mean smoking 1 pack a day for 30 years or 2 packs a day for 15 years). Yearly screening should continue until the smoker has stopped smoking for at least 15 years. Yearly screening should be stopped for people who develop a health problem that would prevent them from having lung cancer treatment.  If you choose to drink alcohol, do not have more than 2  drinks per day. One drink is considered to be 12 oz (360 mL) of beer, 5 oz (150 mL) of wine, or 1.5 oz (45 mL) of liquor.  Avoid use of street drugs. Do not share needles with anyone. Ask for help if you need support or instructions about stopping the use of drugs.  High blood pressure causes heart disease and increases the risk of stroke. Blood pressure should be checked at least every 1 2 years. Ongoing high blood pressure should be treated with  medicines if weight loss and exercise are not effective.  If you are 63 70 years old, ask your health care provider if you should take aspirin to prevent heart disease.  Diabetes screening involves taking a blood sample to check your fasting blood sugar level. This should be done once every 3 years after age 60, if you are at a normal weight and without risk factors for diabetes. Testing should be considered at a younger age or be carried out more frequently if you are overweight and have at least 1 risk factor for diabetes.  Colorectal cancer can be detected and often prevented. Most routine colorectal cancer screening begins at the age of 29 and continues through age 64. However, your health care provider may recommend screening at an earlier age if you have risk factors for colon cancer. On a yearly basis, your health care provider may provide home test kits to check for hidden blood in the stool. A small camera at the end of a tube may be used to directly examine the colon (sigmoidoscopy or colonoscopy) to detect the earliest forms of colorectal cancer. Talk to your health care provider about this at age 24, when routine screening begins. A direct exam of the colon should be repeated every 5 10 years through age 65, unless early forms of pre-cancerous polyps or small growths are found.  People who are at an increased risk for hepatitis B should be screened for this virus. You are considered at high risk for hepatitis B if:  You were born in a country where hepatitis B occurs often. Talk with your health care provider about which countries are considered high-risk.  Your parents were born in a high-risk country and you have not received a shot to protect against hepatitis B (hepatitis B vaccine).  You have HIV or AIDS.  You use needles to inject street drugs.  You live with, or have sex with, someone who has hepatitis B.  You are a man who has sex with other men (MSM).  You get hemodialysis  treatment.  You take certain medicines for conditions like cancer, organ transplantation, and autoimmune conditions.  Hepatitis C blood testing is recommended for all people born from [birth year] through 1967 and any individual with known risk factors for hepatitis C.  Healthy men should no longer receive prostate-specific antigen (PSA) blood tests as part of routine cancer screening. Talk to your health care provider about prostate cancer screening.  Testicular cancer screening is not recommended for adolescents or adult males who have no symptoms. Screening includes self-exam, a health care provider exam, and other screening tests. Consult with your health care provider about any symptoms you have or any concerns you have about testicular cancer.  Practice safe sex. Use condoms and avoid high-risk sexual practices to reduce the spread of sexually transmitted infections (STIs).  Use sunscreen. Apply sunscreen liberally and repeatedly throughout the day. You should seek shade when your shadow is shorter than you. Protect yourself by  wearing long sleeves, pants, a wide-brimmed hat, and sunglasses year round, whenever you are outdoors.  Tell your health care provider of new moles or changes in moles, especially if there is a change in shape or color. Also tell your provider if a mole is larger than the size of a pencil eraser.  A one-time screening for abdominal aortic aneurysm (AAA) and surgical repair of large AAAs by ultrasound is recommended for men aged 41 75 years who are current or former smokers.  Stay current with your vaccines (immunizations). Document Released: 11/16/2007 Document Revised: 03/10/2013 Document Reviewed: 10/15/2010 [facility] Patient Information 2014 [place], [place].

## 2013-09-07 ENCOUNTER — Other Ambulatory Visit (INDEPENDENT_AMBULATORY_CARE_PROVIDER_SITE_OTHER): Payer: BC Managed Care – PPO

## 2013-09-07 DIAGNOSIS — Z0000 Encounter for general adult medical examination without abnormal findings: Secondary | ICD-10-CM

## 2013-09-07 LAB — BASIC METABOLIC PANEL
BUN: 23 mg/dL (ref 6–23)
CO2: 31 mEq/L (ref 19–32)
Calcium: 9.5 mg/dL (ref 8.4–10.5)
Chloride: 107 mEq/L (ref 96–112)
Creatinine, Ser: 1 mg/dL (ref 0.4–1.5)
GFR: 82.32 mL/min (ref >60.00)
GLUCOSE: 101 mg/dL — AB (ref 70–99)
POTASSIUM: 4.8 meq/L (ref 3.5–5.1)
SODIUM: 140 meq/L (ref 135–145)

## 2013-09-07 LAB — URINALYSIS, ROUTINE W REFLEX MICROSCOPIC
BILIRUBIN URINE: NEGATIVE
Ketones, ur: NEGATIVE
LEUKOCYTES UA: NEGATIVE
NITRITE: NEGATIVE
SPECIFIC GRAVITY, URINE: 1.02 (ref 1.000–1.030)
Total Protein, Urine: NEGATIVE
Urine Glucose: NEGATIVE
Urobilinogen, UA: 0.2 (ref 0.0–1.0)
pH: 6 (ref 5.0–8.0)

## 2013-09-07 LAB — CBC WITH DIFFERENTIAL/PLATELET
Basophils Absolute: 0 10*3/uL (ref 0.0–0.1)
Basophils Relative: 0.7 % (ref 0.0–3.0)
EOS PCT: 1.7 % (ref 0.0–5.0)
Eosinophils Absolute: 0.1 10*3/uL (ref 0.0–0.7)
HEMATOCRIT: 43.3 % (ref 39.0–52.0)
HEMOGLOBIN: 14.7 g/dL (ref 13.0–17.0)
LYMPHS ABS: 2.1 10*3/uL (ref 0.7–4.0)
Lymphocytes Relative: 40.3 % (ref 12.0–46.0)
MCHC: 34 g/dL (ref 30.0–36.0)
MCV: 91.6 fl (ref 78.0–100.0)
MONOS PCT: 8 % (ref 3.0–12.0)
Monocytes Absolute: 0.4 10*3/uL (ref 0.1–1.0)
NEUTROS ABS: 2.5 10*3/uL (ref 1.4–7.7)
Neutrophils Relative %: 49.3 % (ref 43.0–77.0)
Platelets: 185 10*3/uL (ref 150.0–400.0)
RBC: 4.72 Mil/uL (ref 4.22–5.81)
RDW: 13.1 % (ref 11.5–14.6)
WBC: 5.1 10*3/uL (ref 4.5–10.5)

## 2013-09-07 LAB — PSA: PSA: 3.56 ng/mL (ref 0.10–4.00)

## 2013-09-07 LAB — LIPID PANEL
CHOL/HDL RATIO: 2
CHOLESTEROL: 211 mg/dL — AB (ref 0–200)
HDL: 90.5 mg/dL (ref >39.00)
LDL CALC: 109 mg/dL — AB (ref 0–99)
Triglycerides: 59 mg/dL (ref 0.0–149.0)
VLDL: 11.8 mg/dL (ref 0.0–40.0)

## 2013-09-07 LAB — HEPATIC FUNCTION PANEL
ALBUMIN: 4 g/dL (ref 3.5–5.2)
ALK PHOS: 40 U/L (ref 39–117)
ALT: 33 U/L (ref 0–53)
AST: 40 U/L — ABNORMAL HIGH (ref 0–37)
BILIRUBIN DIRECT: 0.1 mg/dL (ref 0.0–0.3)
Total Bilirubin: 0.8 mg/dL (ref 0.3–1.2)
Total Protein: 6.2 g/dL (ref 6.0–8.3)

## 2013-09-07 LAB — TSH: TSH: 1.7 u[IU]/mL (ref 0.35–5.50)

## 2013-09-09 ENCOUNTER — Encounter: Payer: Self-pay | Admitting: Internal Medicine

## 2014-01-28 ENCOUNTER — Ambulatory Visit: Payer: BC Managed Care – PPO | Admitting: Internal Medicine

## 2014-03-28 ENCOUNTER — Encounter: Payer: Self-pay | Admitting: Cardiology

## 2014-03-28 ENCOUNTER — Telehealth: Payer: Self-pay | Admitting: Cardiology

## 2014-03-28 NOTE — Telephone Encounter (Signed)
December appts made, responded to pt through his  MyChart message.

## 2014-03-28 NOTE — Telephone Encounter (Signed)
New message           Pt is not available to come into the office on 11/5 / pt would like to know if he can be worked in on Dr. Aundra Dubin schedule before January 6 / pt does not want to see a NP or PA

## 2014-03-30 ENCOUNTER — Ambulatory Visit: Payer: BC Managed Care – PPO | Admitting: Cardiology

## 2014-03-31 ENCOUNTER — Ambulatory Visit: Payer: BC Managed Care – PPO | Admitting: Cardiology

## 2014-04-07 ENCOUNTER — Ambulatory Visit: Payer: BC Managed Care – PPO | Admitting: Cardiology

## 2014-05-06 ENCOUNTER — Ambulatory Visit (INDEPENDENT_AMBULATORY_CARE_PROVIDER_SITE_OTHER): Payer: 59 | Admitting: Cardiology

## 2014-05-06 ENCOUNTER — Encounter: Payer: Self-pay | Admitting: Cardiology

## 2014-05-06 VITALS — BP 122/68 | HR 64 | Ht 67.0 in | Wt 164.1 lb

## 2014-05-06 DIAGNOSIS — E785 Hyperlipidemia, unspecified: Secondary | ICD-10-CM

## 2014-05-06 DIAGNOSIS — Z9189 Other specified personal risk factors, not elsewhere classified: Secondary | ICD-10-CM

## 2014-05-06 DIAGNOSIS — G250 Essential tremor: Secondary | ICD-10-CM

## 2014-05-06 MED ORDER — ATORVASTATIN CALCIUM 20 MG PO TABS: 20.0000 mg | ORAL_TABLET | Freq: Every day | ORAL | Status: DC

## 2014-05-06 NOTE — Patient Instructions (Signed)
Your physician recommends that you return for a  FASTING lipomed panel.  Your physician wants you to follow-up in: 1 year with Dr Aundra Dubin. (December 2016).You will receive a reminder letter in the mail two months in advance. If you don't receive a letter, please call our office to schedule the follow-up appointment.

## 2014-05-08 NOTE — Progress Notes (Signed)
Patient ID: Adam Gaines, male   DOB: 01/26/1946, 68 y.o.   MRN: [id number] PCP: Dr. Asa Lente  68 yo with hyperlipidemia and family history of CAD presents for cardiology followup.  He had a normal ETT in 11/09.  In 10/13, I had him do a noncontrast CT for coronary calcium scoring.  The coronary calcium score was 0.  Since his last appointment here, he has been doing well.  He still Leisure centre manager.  He is active with golfing, treadmill (30 minutes at a time), and Pilates.  He ran a 5K over [place].  He watches his diet.  No chest pain or tightness.  No exertional dyspnea.  No presyncope/syncope.  He has a mild tremor but no bradykinesia or rigidity to suggest parkinsonism.  The tremor is not bad enough that he feels like he needs to take medication for it.   Labs (6/12): LDL 85, HDL 81 Labs (10/13): LDL 78, HDL 80 Labs (4/15): K 4, creatinine 1.0, LDL 109, HDL 90.5  ECG: NSR, normal  PMH: 1. Hyperlipidemia 2. CAD risk: ETT (11/09): No evidence for ischemia or infarction, good exercise tolerance.  Coronary artery calcium score 0 Agatston units in 10/13.  3. Essential tremor 4. Osteoarthritis 5. Diverticulosis 6. Back surgery 2/2 ruptured disc.   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   FH: Father with CVA at 56 (smoked).  Mother with MI at 65 (smoked).    Current Outpatient Prescriptions  Medication Sig Dispense Refill  . aspirin 81 MG tablet Take 81 mg by mouth daily.      Marland Kitchen atorvastatin (LIPITOR) 20 MG tablet Take 1 tablet (20 mg total) by mouth daily. 90 tablet 3  . celecoxib (CELEBREX) 200 MG capsule   3   No current facility-administered medications for this visit.    BP 122/68 mmHg  Pulse 64  Ht 5\' 7"  (1.702 m)  Wt 164 lb 1.9 oz (74.444 kg)  BMI 25.70 kg/m2 General: NAD Neck: No JVD, no thyromegaly or thyroid nodule.  Lungs: Clear to auscultation bilaterally with normal respiratory effort. CV: Nondisplaced PMI.  Heart regular S1/S2, no S3/S4,  no murmur.  No peripheral edema.  No carotid bruit.  Normal pedal pulses.  Abdomen: Soft, nontender, no hepatosplenomegaly, no distention.  Skin: Intact without lesions or rashes.  Neurologic: Alert and oriented x 3. Mild tremor.  Psych: Normal affect. Extremities: No clubbing or cyanosis.   Assessment/Plan: 1. CAD risk: Patient is doing well in general.  He does have a family history of CAD.  He had a coronary artery calcium score of 0 in 2013.  He will continue ASA 81 daily and statin.   2. Hyperlipidemia: I am going to arrange for a Lipomed profile.  If not favorable, will increase statin.   I will see him back in a year.  Loralie Champagne 05/08/2014

## 2014-05-09 ENCOUNTER — Other Ambulatory Visit: Payer: Self-pay | Admitting: Cardiology

## 2014-05-09 ENCOUNTER — Other Ambulatory Visit: Payer: 59

## 2014-05-09 DIAGNOSIS — E785 Hyperlipidemia, unspecified: Secondary | ICD-10-CM

## 2014-05-11 ENCOUNTER — Encounter: Payer: Self-pay | Admitting: Cardiology

## 2014-05-11 LAB — NMR LIPOPROFILE WITH LIPIDS
CHOLESTEROL, TOTAL: 203 mg/dL — AB (ref 100–199)
HDL PARTICLE NUMBER: 36.2 umol/L (ref >=30.5)
HDL Size: 10.6 nm (ref >=9.2)
HDL-C: 85 mg/dL (ref >39)
LDL CALC: 97 mg/dL (ref 0–99)
LDL Particle Number: 894 nmol/L (ref <1000)
LDL Size: 22.1 nm (ref >=20.8)
Large VLDL-P: <0.8 nmol/L (ref <=2.7)
Small LDL Particle Number: <90 nmol/L (ref <=527)
Triglycerides: 106 mg/dL (ref 0–149)
VLDL Size: 44.2 nm (ref <=46.6)

## 2014-06-08 ENCOUNTER — Ambulatory Visit: Payer: BC Managed Care – PPO | Admitting: Cardiology

## 2014-06-10 ENCOUNTER — Other Ambulatory Visit (HOSPITAL_COMMUNITY): Payer: Self-pay

## 2014-06-10 DIAGNOSIS — E785 Hyperlipidemia, unspecified: Secondary | ICD-10-CM

## 2014-06-10 MED ORDER — ATORVASTATIN CALCIUM 20 MG PO TABS: 20.0000 mg | ORAL_TABLET | Freq: Every day | ORAL | Status: DC

## 2014-06-17 ENCOUNTER — Encounter: Payer: Self-pay | Admitting: Cardiology

## 2014-06-20 ENCOUNTER — Other Ambulatory Visit: Payer: Self-pay

## 2014-06-20 DIAGNOSIS — E785 Hyperlipidemia, unspecified: Secondary | ICD-10-CM

## 2014-06-20 MED ORDER — ATORVASTATIN CALCIUM 20 MG PO TABS: 20.0000 mg | ORAL_TABLET | Freq: Every day | ORAL | Status: DC

## 2014-06-26 ENCOUNTER — Encounter: Payer: Self-pay | Admitting: Internal Medicine

## 2014-06-26 ENCOUNTER — Encounter: Payer: Self-pay | Admitting: Cardiology

## 2014-06-28 ENCOUNTER — Other Ambulatory Visit: Payer: Self-pay | Admitting: Cardiology

## 2014-08-15 ENCOUNTER — Telehealth: Payer: Self-pay

## 2014-08-15 NOTE — Telephone Encounter (Signed)
Unable to confirm flu shot/ lines are busy

## 2014-08-23 ENCOUNTER — Other Ambulatory Visit: Payer: Self-pay | Admitting: Cardiology

## 2014-09-12 LAB — PSA: PSA: 3.14

## 2014-10-17 ENCOUNTER — Telehealth: Payer: Self-pay | Admitting: Internal Medicine

## 2014-10-17 NOTE — Telephone Encounter (Signed)
Patient is scheduled an appointment on 5/24 for a physical and was wondering if he can do labs before the appointment.

## 2014-10-17 NOTE — Telephone Encounter (Signed)
yes

## 2014-10-17 NOTE — Telephone Encounter (Signed)
Left message on vm

## 2014-10-17 NOTE — Telephone Encounter (Signed)
Unfortunately, no.  Pt will need to be seen and then have labs done.   Can you call?

## 2014-10-25 ENCOUNTER — Ambulatory Visit (INDEPENDENT_AMBULATORY_CARE_PROVIDER_SITE_OTHER): Payer: 59 | Admitting: Internal Medicine

## 2014-10-25 ENCOUNTER — Encounter: Payer: Self-pay | Admitting: Internal Medicine

## 2014-10-25 VITALS — BP 124/80 | HR 60 | Temp 97.5°F | Ht 67.0 in | Wt 163.8 lb

## 2014-10-25 DIAGNOSIS — R251 Tremor, unspecified: Secondary | ICD-10-CM | POA: Diagnosis not present

## 2014-10-25 DIAGNOSIS — E559 Vitamin D deficiency, unspecified: Secondary | ICD-10-CM

## 2014-10-25 DIAGNOSIS — Z0000 Encounter for general adult medical examination without abnormal findings: Secondary | ICD-10-CM | POA: Diagnosis not present

## 2014-10-25 NOTE — Progress Notes (Signed)
Subjective:    Patient ID: Adam Gaines, male    DOB: 13-Oct-1945, 69 y.o.   MRN: [id number]  HPI  patient is here today for annual physical.  Reviewed chronic conditions, interval events and current concerns  Patient notes mildly progressive right hand tremor over past 4-5 years. It is his only during intentional activities such as handwriting, drinking coffee or eating soup. Patient is right-hand dominant. Patient denies weakness, gait disorder, headache, numbness. Denies involvement of voice, head or neck. Lower extremities not affected. Symptoms appear worse in a.m. but are present throughout the day. Otherwise, patient feels well and has no other complaints.  Past Medical History  Diagnosis Date  . Personal history of other disorder of urinary system   . Lumbago   . Chronic eczema     of the plantar aspect right foot  . Chickenpox   . Other and unspecified hyperlipidemia   . Colon polyps     Tubular Adenoma 2008  . Diverticulosis   . Intestinal angiodysplasia   . Benign essential tremor 11/18/2011    Seen for this November 18, 2011   . Cardiovascular risk factor 03/08/2012  . TONSILLECTOMY AND ADENOIDECTOMY, HX OF 07/24/2007    Qualifier: Diagnosis of  By: Genelle Gather CMA, [place]     Family History  Problem Relation Age of Onset  . Heart attack Mother     after aspiration  . Coronary artery disease Mother   . Heart disease Mother   . COPD Mother     smoker  . Coronary artery disease Father   . Heart attack Father     fatal  . Heart disease Father   . Hyperlipidemia Father   . Hypertension Father   . Diabetes Maternal Grandmother   . Other Other     cardiovascular disease  . Diabetes Paternal Grandmother    History  Substance Use Topics  . Smoking status: Former Smoker    Quit date: 11/28/1978  . Smokeless tobacco: Never Used  . Alcohol Use: 1.0 oz/week    2 drink(s) per week     Review of Systems  Constitutional: Negative for fever, activity change, appetite  change, fatigue and unexpected weight change.  Respiratory: Negative for cough, chest tightness, shortness of breath and wheezing.   Cardiovascular: Negative for chest pain, palpitations and leg swelling.  Neurological: Positive for tremors (right hand -noted only with activities of daily living including handwriting and drinking coffee or eating soup). Negative for dizziness, weakness and headaches.  Psychiatric/Behavioral: Negative for dysphoric mood. The patient is not nervous/anxious.   All other systems reviewed and are negative.      Objective:    Physical Exam  Constitutional: He is oriented to person, place, and time. He appears well-developed and well-nourished. No distress.  HENT:  Head: Normocephalic and atraumatic.  Nose: Nose normal.  Mouth/Throat: Oropharynx is clear and moist.  Hearing grossly normal.  Eyes: Conjunctivae and EOM are normal. Pupils are equal, round, and reactive to light. No scleral icterus.  Neck: Normal range of motion. Neck supple. No JVD present. No thyromegaly present.  Cardiovascular: Normal rate, regular rhythm, normal heart sounds and intact distal pulses.  Exam reveals no friction rub.   No murmur heard. No edema.  Pulmonary/Chest: Effort normal and breath sounds normal. No respiratory distress. He has no wheezes.  Abdominal: Soft. Bowel sounds are normal. He exhibits no distension and no mass. There is no tenderness. There is no guarding.  Genitourinary:  defer  Musculoskeletal:  Normal range of motion. He exhibits no edema or tenderness.  Lymphadenopathy:    He has no cervical adenopathy.  Neurological: He is alert and oriented to person, place, and time. He has normal reflexes. No cranial nerve deficit.  Very fine intention tremor of right hand with activity. No fasciculations, atrophy or myoclonus  Skin: Skin is warm and dry. No rash noted. No erythema.  Psychiatric: He has a normal mood and affect. His behavior is normal. Thought content  normal.    BP 124/80 mmHg  Pulse 60  Temp(Src) 97.5 F (36.4 C) (Oral)  Ht 5\' 7"  (1.702 m)  Wt 163 lb 12 oz (74.277 kg)  BMI 25.64 kg/m2  SpO2 95% Wt Readings from Last 3 Encounters:  10/25/14 163 lb 12 oz (74.277 kg)  05/06/14 164 lb 1.9 oz (74.444 kg)  09/01/13 164 lb 12.8 oz (74.753 kg)    Lab Results  Component Value Date   WBC 5.1 09/07/2013   HGB 14.7 09/07/2013   HCT 43.3 09/07/2013   PLT 185.0 09/07/2013   GLUCOSE 101* 09/07/2013   CHOL 203* 05/09/2014   TRIG 106 05/09/2014   HDL 85 05/09/2014   LDLCALC 97 05/09/2014   ALT 33 09/07/2013   AST 40* 09/07/2013   NA 140 09/07/2013   K 4.8 09/07/2013   CL 107 09/07/2013   CREATININE 1.0 09/07/2013   BUN 23 09/07/2013   CO2 31 09/07/2013   TSH 1.70 09/07/2013   PSA 3.14 09/12/2014    Ct Cardiac Scoring  03/12/2012   **ADDENDUM** CREATED: 03/12/2012 18:04:37  CARDIAC CTA WITH CALCIUM SCORE 03/11/2012 15:18:00  Ordering Physician: DALTONMCLEAN  Reading Physician: DaltonS.Mclean  Protocol:  The patient scanned on a Siemens 16 slice scanner. After an initial AP and lateral topogram, 3 mm axial slices were performed through the heart for calcium scoring.  Indications: CAD risk factors  DETAILED FINDINGS:  Quality of Study: Good  Coronary Calcium Score: 0  Noncardiac findings to be read by [facility].  IMPRESSION: Coronary artery calcium score of 0, placing the patient in a low risk category for future cardiovascular events.  **END ADDENDUM** SIGNED BY: Dalton S. Mclean  03/11/2012   OVER-READ INTERPRETATION - CT CHEST  The following report is an over-read performed by radiologist Dr. Karn Cassis. Reche Dixon, M.D. of [facility], [place] on 03/11/2012 16:42:08.  This over-read does not include interpretation of cardiac or coronary anatomy or pathology.  The coronary calcium score interpretation by the cardiologist is attached.  Findings: Lung windows demonstrate minimal scarring at the left lung base.  Soft tissue windows  demonstrate ascending aortic size upper normal, 3.8 cm on image 1 of series 5.  Incompletely imaged.  No pericardial or pleural effusion.  No imaged thoracic adenopathy within the included chest.  No pericardial or pleural effusion.  Limited abdominal imaging demonstrates right liver lobe lesion which measures 1.0 cm and is relatively well circumscribed and fluid density on image 37.  A sub centimeter low density hepatic dome lesion on image 34 which is also favored to represent a small cyst.  Prominent osteophytes within the thoracic spine.  IMPRESSION  1.  No acute extracardiac findings within the chest. 2.  Incompletely imaged borderline dilated ascending aorta. 3.  2 low density liver lesions. Most likely cysts.  If there is any history of primary malignancy, recommend correlation with prior cross-sectional imaging and consideration of pre and post contrast abdominal MRI.  Coronary Calcium Score:  Indication: Risk stratification  The patient was  scanned on a Siemens Sensation 16 slice scanner. Noncontrast axial slices were done through the heart.  Calicum scoring was done on a dedicated work station using the [employer]   Findings:  Coronary calcium score 0  Upper limits of normal ascending aortic root 3.8 cm  See separate report from Dr Jeronimo Greaves regarding noncardiac findings and possible liver cysts.  Charlton Haws MD [facility]   Original Report Authenticated By: Gretta Began       Assessment & Plan:   CPX/z00.00 - Patient has been counseled on age-appropriate routine health concerns for screening and prevention. These are reviewed and up-to-date. Immunizations are up-to-date or declined. Labs and reviewed.  Mild intentional tremor right hand. Patient right-hand-dominant, progressive symptoms over years. No family history of movement disorder but parents and relatives died relatively young from coronary complications. Referral to movement disorder specialist Dr. Wandalee Ferdinand at patient request. Check associated  metabolic labs and instructed on minimizing caffeine and other stimulant intake which may exacerbate tremor  Problem List Items Addressed This Visit    None    Visit Diagnoses    Routine general medical examination at a health care facility    -  Primary    Relevant Orders    Basic metabolic panel    CBC with Differential/Platelet    Hepatic function panel    Lipid panel    TSH    Vitamin B12    Vitamin B1    Vit D  25 hydroxy (rtn osteoporosis monitoring)    Tremor of right hand        Relevant Orders    Basic metabolic panel    CBC with Differential/Platelet    Hepatic function panel    Lipid panel    TSH    Vitamin B12    Vitamin B1    Vit D  25 hydroxy (rtn osteoporosis monitoring)    Ambulatory referral to Neurology        Rene Paci, MD

## 2014-10-25 NOTE — Progress Notes (Signed)
Pre visit review using our clinic review tool, if applicable. No additional management support is needed unless otherwise documented below in the visit note. 

## 2014-10-25 NOTE — Patient Instructions (Addendum)
It was good to see you today.  We have reviewed your prior records including labs and tests today  Health Maintenance reviewed - all recommended immunizations and age-appropriate screenings are up-to-date.  Test(s) ordered today. Return when you are fasting. Your results will be released to Shirley (or called to you) after review, usually within 72hours after test completion. If any changes need to be made, you will be notified at that same time.  Medications reviewed and updated, no changes recommended at this time.  we'll make referral to Dr. Carles Collet for evaluation of tremor symptoms . Our office will contact you regarding appointment(s) once made.  Please schedule followup in 12 months for annual exam and labs, call sooner if problems.  Health Maintenance A healthy lifestyle and preventative care can promote health and wellness.  Maintain regular health, dental, and eye exams.  Eat a healthy diet. Foods like vegetables, fruits, whole grains, low-fat dairy products, and lean protein foods contain the nutrients you need and are low in calories. Decrease your intake of foods high in solid fats, added sugars, and salt. Get information about a proper diet from your health care provider, if necessary.  Regular physical exercise is one of the most important things you can do for your health. Most adults should get at least 150 minutes of moderate-intensity exercise (any activity that increases your heart rate and causes you to sweat) each week. In addition, most adults need muscle-strengthening exercises on 2 or more days a week.   Maintain a healthy weight. The body mass index (BMI) is a screening tool to identify possible weight problems. It provides an estimate of body fat based on height and weight. Your health care provider can find your BMI and can help you achieve or maintain a healthy weight. For males 20 years and older:  A BMI below 18.5 is considered underweight.  A BMI of 18.5 to 24.9  is normal.  A BMI of 25 to 29.9 is considered overweight.  A BMI of 30 and above is considered obese.  Maintain normal blood lipids and cholesterol by exercising and minimizing your intake of saturated fat. Eat a balanced diet with plenty of fruits and vegetables. Blood tests for lipids and cholesterol should begin at age 54 and be repeated every 5 years. If your lipid or cholesterol levels are high, you are over age 72, or you are at high risk for heart disease, you may need your cholesterol levels checked more frequently.Ongoing high lipid and cholesterol levels should be treated with medicines if diet and exercise are not working.  If you smoke, find out from your health care provider how to quit. If you do not use tobacco, do not start.  Lung cancer screening is recommended for adults aged 42-80 years who are at high risk for developing lung cancer because of a history of smoking. A yearly low-dose CT scan of the lungs is recommended for people who have at least a 30-pack-year history of smoking and are current smokers or have quit within the past 15 years. A pack year of smoking is smoking an average of 1 pack of cigarettes a day for 1 year (for example, a 30-pack-year history of smoking could mean smoking 1 pack a day for 30 years or 2 packs a day for 15 years). Yearly screening should continue until the smoker has stopped smoking for at least 15 years. Yearly screening should be stopped for people who develop a health problem that would prevent them from having  lung cancer treatment.  If you choose to drink alcohol, do not have more than 2 drinks per day. One drink is considered to be 12 oz (360 mL) of beer, 5 oz (150 mL) of wine, or 1.5 oz (45 mL) of liquor.  Avoid the use of street drugs. Do not share needles with anyone. Ask for help if you need support or instructions about stopping the use of drugs.  High blood pressure causes heart disease and increases the risk of stroke. Blood  pressure should be checked at least every 1-2 years. Ongoing high blood pressure should be treated with medicines if weight loss and exercise are not effective.  If you are 21-81 years old, ask your health care provider if you should take aspirin to prevent heart disease.  Diabetes screening involves taking a blood sample to check your fasting blood sugar level. This should be done once every 3 years after age 56 if you are at a normal weight and without risk factors for diabetes. Testing should be considered at a younger age or be carried out more frequently if you are overweight and have at least 1 risk factor for diabetes.  Colorectal cancer can be detected and often prevented. Most routine colorectal cancer screening begins at the age of 29 and continues through age 29. However, your health care provider may recommend screening at an earlier age if you have risk factors for colon cancer. On a yearly basis, your health care provider may provide home test kits to check for hidden blood in the stool. A small camera at the end of a tube may be used to directly examine the colon (sigmoidoscopy or colonoscopy) to detect the earliest forms of colorectal cancer. Talk to your health care provider about this at age 37 when routine screening begins. A direct exam of the colon should be repeated every 5-10 years through age 28, unless early forms of precancerous polyps or small growths are found.  People who are at an increased risk for hepatitis B should be screened for this virus. You are considered at high risk for hepatitis B if:  You were born in a country where hepatitis B occurs often. Talk with your health care provider about which countries are considered high risk.  Your parents were born in a high-risk country and you have not received a shot to protect against hepatitis B (hepatitis B vaccine).  You have HIV or AIDS.  You use needles to inject street drugs.  You live with, or have sex with,  someone who has hepatitis B.  You are a man who has sex with other men (MSM).  You get hemodialysis treatment.  You take certain medicines for conditions like cancer, organ transplantation, and autoimmune conditions.  Hepatitis C blood testing is recommended for all people born from [birth year] through 1967 and any individual with known risk factors for hepatitis C.  Healthy men should no longer receive prostate-specific antigen (PSA) blood tests as part of routine cancer screening. Talk to your health care provider about prostate cancer screening.  Testicular cancer screening is not recommended for adolescents or adult males who have no symptoms. Screening includes self-exam, a health care provider exam, and other screening tests. Consult with your health care provider about any symptoms you have or any concerns you have about testicular cancer.  Practice safe sex. Use condoms and avoid high-risk sexual practices to reduce the spread of sexually transmitted infections (STIs).  You should be screened for STIs, including gonorrhea and  chlamydia if:  You are sexually active and are younger than 24 years.  You are older than 24 years, and your health care provider tells you that you are at risk for this type of infection.  Your sexual activity has changed since you were last screened, and you are at an increased risk for chlamydia or gonorrhea. Ask your health care provider if you are at risk.  If you are at risk of being infected with HIV, it is recommended that you take a prescription medicine daily to prevent HIV infection. This is called pre-exposure prophylaxis (PrEP). You are considered at risk if:  You are a man who has sex with other men (MSM).  You are a heterosexual man who is sexually active with multiple partners.  You take drugs by injection.  You are sexually active with a partner who has HIV.  Talk with your health care provider about whether you are at high risk of being  infected with HIV. If you choose to begin PrEP, you should first be tested for HIV. You should then be tested every 3 months for as long as you are taking PrEP.  Use sunscreen. Apply sunscreen liberally and repeatedly throughout the day. You should seek shade when your shadow is shorter than you. Protect yourself by wearing long sleeves, pants, a wide-brimmed hat, and sunglasses year round whenever you are outdoors.  Tell your health care provider of new moles or changes in moles, especially if there is a change in shape or color. Also, tell your health care provider if a mole is larger than the size of a pencil eraser.  A one-time screening for abdominal aortic aneurysm (AAA) and surgical repair of large AAAs by ultrasound is recommended for men aged 67-75 years who are current or former smokers.  Stay current with your vaccines (immunizations). Document Released: 11/16/2007 Document Revised: 05/25/2013 Document Reviewed: 10/15/2010 [facility] Patient Information 2015 [place], [place]. This information is not intended to replace advice given to you by your health care provider. Make sure you discuss any questions you have with your health care provider.

## 2014-10-26 ENCOUNTER — Other Ambulatory Visit (INDEPENDENT_AMBULATORY_CARE_PROVIDER_SITE_OTHER): Payer: 59

## 2014-10-26 DIAGNOSIS — R251 Tremor, unspecified: Secondary | ICD-10-CM

## 2014-10-26 DIAGNOSIS — E559 Vitamin D deficiency, unspecified: Secondary | ICD-10-CM | POA: Insufficient documentation

## 2014-10-26 DIAGNOSIS — Z0000 Encounter for general adult medical examination without abnormal findings: Secondary | ICD-10-CM

## 2014-10-26 LAB — HEPATIC FUNCTION PANEL
ALT: 30 U/L (ref 0–53)
AST: 28 U/L (ref 0–37)
Albumin: 4.2 g/dL (ref 3.5–5.2)
Alkaline Phosphatase: 39 U/L (ref 39–117)
Bilirubin, Direct: 0.1 mg/dL (ref 0.0–0.3)
Total Bilirubin: 0.5 mg/dL (ref 0.2–1.2)
Total Protein: 6.3 g/dL (ref 6.0–8.3)

## 2014-10-26 LAB — BASIC METABOLIC PANEL
BUN: 19 mg/dL (ref 6–23)
CO2: 30 mEq/L (ref 19–32)
Calcium: 9.4 mg/dL (ref 8.4–10.5)
Chloride: 105 mEq/L (ref 96–112)
Creatinine, Ser: 0.94 mg/dL (ref 0.40–1.50)
GFR: 84.07 mL/min (ref >60.00)
Glucose, Bld: 97 mg/dL (ref 70–99)
Potassium: 4.9 mEq/L (ref 3.5–5.1)
Sodium: 140 mEq/L (ref 135–145)

## 2014-10-26 LAB — CBC WITH DIFFERENTIAL/PLATELET
BASOS PCT: 0.7 % (ref 0.0–3.0)
Basophils Absolute: 0 10*3/uL (ref 0.0–0.1)
EOS PCT: 2.2 % (ref 0.0–5.0)
Eosinophils Absolute: 0.1 10*3/uL (ref 0.0–0.7)
HEMATOCRIT: 45.2 % (ref 39.0–52.0)
Hemoglobin: 15.3 g/dL (ref 13.0–17.0)
LYMPHS ABS: 2.1 10*3/uL (ref 0.7–4.0)
Lymphocytes Relative: 37.6 % (ref 12.0–46.0)
MCHC: 34 g/dL (ref 30.0–36.0)
MCV: 91.8 fl (ref 78.0–100.0)
Monocytes Absolute: 0.4 10*3/uL (ref 0.1–1.0)
Monocytes Relative: 7.9 % (ref 3.0–12.0)
NEUTROS ABS: 2.9 10*3/uL (ref 1.4–7.7)
Neutrophils Relative %: 51.6 % (ref 43.0–77.0)
Platelets: 191 10*3/uL (ref 150.0–400.0)
RBC: 4.92 Mil/uL (ref 4.22–5.81)
RDW: 13.7 % (ref 11.5–15.5)
WBC: 5.6 10*3/uL (ref 4.0–10.5)

## 2014-10-26 LAB — TSH: TSH: 1.69 u[IU]/mL (ref 0.35–4.50)

## 2014-10-26 LAB — LIPID PANEL
Cholesterol: 200 mg/dL (ref 0–200)
HDL: 83 mg/dL (ref >39.00)
LDL Cholesterol: 100 mg/dL — ABNORMAL HIGH (ref 0–99)
NonHDL: 117
TRIGLYCERIDES: 86 mg/dL (ref 0.0–149.0)
Total CHOL/HDL Ratio: 2
VLDL: 17.2 mg/dL (ref 0.0–40.0)

## 2014-10-26 LAB — VITAMIN B12: Vitamin B-12: 372 pg/mL (ref 211–911)

## 2014-10-26 LAB — VITAMIN D 25 HYDROXY (VIT D DEFICIENCY, FRACTURES): VITD: 12.62 ng/mL — AB (ref 30.00–100.00)

## 2014-10-26 MED ORDER — VITAMIN D 50 MCG (2000 UT) PO TABS: 2000.0000 [IU] | ORAL_TABLET | Freq: Every day | ORAL | Status: AC

## 2014-10-26 NOTE — Addendum Note (Signed)
Addended by: Gwendolyn Grant A on: 10/26/2014 12:48 PM   Modules accepted: Orders, SmartSet

## 2014-10-30 LAB — VITAMIN B1: Vitamin B1 (Thiamine): 7 nmol/L — ABNORMAL LOW (ref 8–30)

## 2014-11-09 ENCOUNTER — Ambulatory Visit (INDEPENDENT_AMBULATORY_CARE_PROVIDER_SITE_OTHER): Payer: 59 | Admitting: Neurology

## 2014-11-09 ENCOUNTER — Encounter: Payer: Self-pay | Admitting: Neurology

## 2014-11-09 ENCOUNTER — Other Ambulatory Visit (INDEPENDENT_AMBULATORY_CARE_PROVIDER_SITE_OTHER): Payer: 59

## 2014-11-09 VITALS — BP 126/78 | HR 63 | Resp 16 | Wt 164.0 lb

## 2014-11-09 DIAGNOSIS — R251 Tremor, unspecified: Secondary | ICD-10-CM | POA: Diagnosis not present

## 2014-11-09 DIAGNOSIS — E538 Deficiency of other specified B group vitamins: Secondary | ICD-10-CM | POA: Diagnosis not present

## 2014-11-09 DIAGNOSIS — R739 Hyperglycemia, unspecified: Secondary | ICD-10-CM

## 2014-11-09 DIAGNOSIS — G250 Essential tremor: Secondary | ICD-10-CM

## 2014-11-09 DIAGNOSIS — G609 Hereditary and idiopathic neuropathy, unspecified: Secondary | ICD-10-CM

## 2014-11-09 LAB — FOLATE: Folate: 24.3 ng/mL (ref >5.9)

## 2014-11-09 LAB — HEMOGLOBIN A1C: Hgb A1c MFr Bld: 5.3 % (ref 4.6–6.5)

## 2014-11-09 NOTE — Progress Notes (Signed)
Subjective:   Adam Gaines was seen in consultation in the movement disorder clinic at the request of Gwendolyn Grant, MD.  The evaluation is for tremor.  He is accompanied by his wife who supplements the history.  The patient is a 69 y.o. right handed male with a history of tremor.  Pt states that "my penmenship now sucks."  He has trouble with tremor when picking up a heavy cup or with eating soup.  His daughter recently told him that she noted tremor and pushed for the referral.  He doesn't know if it is both hands that have the tremor as he is "very right handed."  Tremor started about a few years ago (documented in 11/2011 in records).  He doesn't think that it has changed since it has started.  There is no known family hx of tremor.    Affected by caffeine:  Unknown (drinks 2 cups coffee per day and no other sources of caffeine) Affected by alcohol:  No. Affected by stress:  No. Affected by fatigue:  No. Spills soup if on spoon:  Yes.   Spills glass of liquid if full:  Yes.    Affects ADL's (tying shoes, brushing teeth, etc):  No., except shaving (blade)  Other sx's:    No change in voice No swallowing trouble Sleeping well; no vivid dreams;  No trouble getting out of deep couch, chair, car except has stiffness Good balance No falls; had drop on L after back surgery - regained most of strength  Current/Previously tried tremor medications: n/a  Current medications that may exacerbate tremor:  none  Outside reports reviewed: historical medical records, lab reports and referral letter/letters.  No Known Allergies  Outpatient Encounter Prescriptions as of 11/09/2014  Medication Sig  . aspirin 81 MG tablet Take 81 mg by mouth daily.    Marland Kitchen atorvastatin (LIPITOR) 20 MG tablet Take 1 tablet by mouth  daily  . b complex vitamins tablet Take 1 tablet by mouth daily.  . Cholecalciferol (VITAMIN D) 2000 UNITS tablet Take 1 tablet (2,000 Units total) by mouth daily.   No  facility-administered encounter medications on file as of 11/09/2014.    Past Medical History  Diagnosis Date  . Personal history of other disorder of urinary system   . Lumbago   . Chronic eczema     of the plantar aspect right foot  . Chickenpox   . Other and unspecified hyperlipidemia   . Colon polyps     Tubular Adenoma 2008  . Diverticulosis   . Intestinal angiodysplasia   . Benign essential tremor 11/18/2011    Seen for this November 18, 2011   . Cardiovascular risk factor 03/08/2012  . TONSILLECTOMY AND ADENOIDECTOMY, HX OF 07/24/2007    Qualifier: Diagnosis of  By: [place], [place]      Past Surgical History  Procedure Laterality Date  . Repair of deviated septum    . Left l5 hemilaminotomy with microdiskectomy    . Tonsillectomy      History   Social History  . Marital Status: Married    Spouse Name: N/A  . Number of Children: 2  . Years of Education: 19   Occupational History  . 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 Smoking status: Former Smoker    Quit date: 11/28/1978  . Smokeless tobacco: Never Used  . Alcohol Use: 1.2 oz/week    2 Standard drinks or equivalent per  week     Comment: social  . Drug Use: No  . Sexual Activity:    Partners: Female   Other Topics Concern  . Not on file   Social History Narrative   HSG, [place], [place] JD '70. [facility] '70-'74. Marriage '67 and  in good health. 2 dtrs - '69, '72; 6 grandchildren. Work - Furniture conservator/restorer.                         Family Status  Relation Status Death Age  . Mother Deceased 21    MI  . Father Deceased 63    CAD/MI  . Daughter Alive     [phone]  . Daughter Alive     [phone]  . Brother Alive   . Brother Alive     Review of Systems Some SOB with exertion.  No lateralizing weakness/paresthesias.  A complete 10 system ROS was obtained and was negative apart from what is mentioned.   Objective:   VITALS:     Filed Vitals:   11/09/14 1015  BP: 126/78  Pulse: 63  Resp: 16  Weight: 164 lb (74.39 kg)  SpO2: 98%   Gen:  Appears stated age and in NAD. HEENT:  Normocephalic, atraumatic. The mucous membranes are moist. The superficial temporal arteries are without ropiness or tenderness. Cardiovascular: Regular rate and rhythm. Lungs: Clear to auscultation bilaterally. Neck: There are no carotid bruits noted bilaterally.  NEUROLOGICAL:  Orientation:  The patient is alert and oriented x 3.  Recent and remote memory are intact.  Attention span and concentration are normal.  Able to name objects and repeat without trouble.  Fund of knowledge is appropriate Cranial nerves: There is good facial symmetry. The pupils are equal round and reactive to light bilaterally. Fundoscopic exam reveals clear disc margins bilaterally. Extraocular muscles are intact and visual fields are full to confrontational testing. Speech is fluent and clear. Soft palate rises symmetrically and there is no tongue deviation. Hearing is intact to conversational tone. Tone: Tone is good throughout. Sensation: Sensation is intact to light touch and pinprick throughout (facial, trunk, extremities). Vibration is decreased at the bilateral big toe, ankle and knee. There is no extinction with double simultaneous stimulation. There is no sensory dermatomal level identified. Coordination:  The patient has no dysdiadichokinesia or dysmetria. Motor: Strength is 5/5 in the bilateral upper and lower extremities.  Shoulder shrug is equal bilaterally.  There is no pronator drift.  There are no fasciculations noted. DTR's: Deep tendon reflexes are 2/4 at the bilateral biceps, triceps, brachioradialis, patella and 1/4 at the bilateral achilles.  Plantar responses are downgoing bilaterally. Gait and Station: The patient is able to ambulate without difficulty. The patient is able to heel toe walk without any difficulty. The patient is able to ambulate in  a tandem fashion. The patient is able to stand in the Romberg position.   MOVEMENT EXAM: Tremor:  There is mild tremor in the UE, noted most significantly with intention.  The right is more noticeable than the left.  He has no significant difficulty with Archimedes spirals, but tremor is evident on the right.  While he does spills water when he pours it from one glass to another, he also pours it very carelessly and quickly.  There is no rest tremor.  He has no tremor with distraction procedures.  LABS  Lab Results  Component Value Date   VITAMINB12  372 10/26/2014   Lab Results  Component Value Date   TSH 1.69 10/26/2014   Lab Results  Component Value Date   WBC 5.6 10/26/2014   HGB 15.3 10/26/2014   HCT 45.2 10/26/2014   MCV 91.8 10/26/2014   PLT 191.0 10/26/2014     Chemistry      Component Value Date/Time   NA 140 10/26/2014 0731   K 4.9 10/26/2014 0731   CL 105 10/26/2014 0731   CO2 30 10/26/2014 0731   BUN 19 10/26/2014 0731   CREATININE 0.94 10/26/2014 0731      Component Value Date/Time   CALCIUM 9.4 10/26/2014 0731   ALKPHOS 39 10/26/2014 0731   AST 28 10/26/2014 0731   ALT 30 10/26/2014 0731   BILITOT 0.5 10/26/2014 0731     No results found for: HGBA1C      Assessment/Plan:   1.  Essential Tremor.  -This is evidenced by the symmetrical nature and longstanding hx of gradually getting worse.  -Reassurance that I saw no evidence of a neurodegenerative disorder such as Parkinson's disease.  -We talked about limiting caffeine use.  We talked about pathophysiology and prognosis.  We talked about the effect that stress and exercise has on tremor.  He was given information on the international essential tremor Foundation.  Patient handouts were provided.  Greater than 50% of the 60 minute visit spent in counseling. 2.  Possible peripheral neuropathy  -evidence of this on examination but clinically doesn't c/o sx's.    -We will check some lab work in this regard.   Fasting blood sugar about a year ago was just mildly elevated so we'll check his hemoglobin A1c.  We will also check folate, RPR and SPEP.  He just started on a B complex vitamin.  B12 was just mildly below. 3.  He will follow up with me on an as-needed basis.

## 2014-11-10 LAB — RPR

## 2014-11-11 LAB — SPEP & IFE WITH QIG
Albumin ELP: 4.2 g/dL (ref 3.8–4.8)
Alpha-1-Globulin: 0.3 g/dL (ref 0.2–0.3)
Alpha-2-Globulin: 0.6 g/dL (ref 0.5–0.9)
Beta 2: 0.3 g/dL (ref 0.2–0.5)
Beta Globulin: 0.4 g/dL (ref 0.4–0.6)
GAMMA GLOBULIN: 0.7 g/dL — AB (ref 0.8–1.7)
IgA: 116 mg/dL (ref 68–379)
IgG (Immunoglobin G), Serum: 705 mg/dL (ref 650–1600)
IgM, Serum: 122 mg/dL (ref 41–251)
Total Protein, Serum Electrophoresis: 6.5 g/dL (ref 6.1–8.1)

## 2014-11-17 ENCOUNTER — Encounter: Payer: Self-pay | Admitting: Neurology

## 2015-04-05 ENCOUNTER — Other Ambulatory Visit (INDEPENDENT_AMBULATORY_CARE_PROVIDER_SITE_OTHER): Payer: 59 | Admitting: *Deleted

## 2015-04-05 DIAGNOSIS — E785 Hyperlipidemia, unspecified: Secondary | ICD-10-CM | POA: Diagnosis not present

## 2015-04-05 LAB — HEPATIC FUNCTION PANEL
ALT: 33 U/L (ref 9–46)
AST: 37 U/L — AB (ref 10–35)
Albumin: 4.1 g/dL (ref 3.6–5.1)
Alkaline Phosphatase: 42 U/L (ref 40–115)
BILIRUBIN DIRECT: 0.1 mg/dL (ref <=0.2)
BILIRUBIN INDIRECT: 0.4 mg/dL (ref 0.2–1.2)
Total Bilirubin: 0.5 mg/dL (ref 0.2–1.2)
Total Protein: 6.5 g/dL (ref 6.1–8.1)

## 2015-04-05 LAB — LIPID PANEL
CHOL/HDL RATIO: 2 ratio (ref <=5.0)
Cholesterol: 172 mg/dL (ref 125–200)
HDL: 84 mg/dL (ref >=40)
LDL Cholesterol: 76 mg/dL (ref <130)
Triglycerides: 62 mg/dL (ref <150)
VLDL: 12 mg/dL (ref <30)

## 2015-04-05 NOTE — Addendum Note (Signed)
Addended by: Eulis Foster on: 04/05/2015 07:53 AM   Modules accepted: Orders

## 2015-04-06 ENCOUNTER — Ambulatory Visit (INDEPENDENT_AMBULATORY_CARE_PROVIDER_SITE_OTHER): Payer: 59 | Admitting: Cardiology

## 2015-04-06 ENCOUNTER — Encounter: Payer: Self-pay | Admitting: Cardiology

## 2015-04-06 VITALS — BP 118/70 | HR 56 | Ht 67.0 in | Wt 164.8 lb

## 2015-04-06 DIAGNOSIS — Z9189 Other specified personal risk factors, not elsewhere classified: Secondary | ICD-10-CM | POA: Diagnosis not present

## 2015-04-06 DIAGNOSIS — E785 Hyperlipidemia, unspecified: Secondary | ICD-10-CM | POA: Diagnosis not present

## 2015-04-06 MED ORDER — ATORVASTATIN CALCIUM 20 MG PO TABS: ORAL_TABLET | ORAL | Status: DC

## 2015-04-06 NOTE — Patient Instructions (Signed)
Medication Instructions:  No changes today  Labwork: No changes today.  Testing/Procedures: None today  Follow-Up: Your physician wants you to follow-up in: 1 year with Dr Aundra Dubin. (November 2017). You will receive a reminder letter in the mail two months in advance. If you don't receive a letter, please call our office to schedule the follow-up appointment.        If you need a refill on your cardiac medications before your next appointment, please call your pharmacy.

## 2015-04-08 NOTE — Progress Notes (Signed)
Patient ID: Adam Gaines, male   DOB: 06/21/1945, 69 y.o.   MRN: [id number] PCP: Dr. Asa Lente  69 yo with hyperlipidemia and family history of CAD presents for cardiology followup.  He had a normal ETT in 11/09.  In 10/13, I had him do a noncontrast CT for coronary calcium scoring.  The coronary calcium score was 0.  Since his last appointment here, he has been doing well.  He still Leisure centre manager.  He is active with golfing, treadmill (30 minutes at a time), and Pilates.  No chest pain or tightness.  No exertional dyspnea.  No presyncope/syncope.  He has some neck pain from arthritis.  Labs (6/12): LDL 85, HDL 81 Labs (10/13): LDL 78, HDL 80 Labs (4/15): K 4, creatinine 1.0, LDL 109, HDL 90.5 Labs (11/16): LDL 76, HDL 84  ECG: NSR, normal  PMH: 1. Hyperlipidemia 2. CAD risk: ETT (11/09): No evidence for ischemia or infarction, good exercise tolerance.  Coronary artery calcium score 0 Agatston units in 10/13.  3. Essential tremor 4. Osteoarthritis 5. Diverticulosis 6. Back surgery 2/2 ruptured disc.  7. C-spine arthritis  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   FH: Father with CVA at 24 (smoked).  Mother with MI at 39 (smoked).    Current Outpatient Prescriptions  Medication Sig Dispense Refill  . aspirin 81 MG tablet Take 81 mg by mouth daily.      Marland Kitchen atorvastatin (LIPITOR) 20 MG tablet Take 1 tablet by mouth  daily 90 tablet 2  . b complex vitamins tablet Take 1 tablet by mouth daily.    . Cholecalciferol (VITAMIN D) 2000 UNITS tablet Take 1 tablet (2,000 Units total) by mouth daily. 30 tablet 11   No current facility-administered medications for this visit.    BP 118/70 mmHg  Pulse 56  Ht 5\' 7"  (1.702 m)  Wt 164 lb 12.8 oz (74.753 kg)  BMI 25.81 kg/m2 General: NAD Neck: No JVD, no thyromegaly or thyroid nodule.  Lungs: Clear to auscultation bilaterally with normal respiratory effort. CV: Nondisplaced PMI.  Heart regular S1/S2, no S3/S4, no murmur.   No peripheral edema.  No carotid bruit.  Normal pedal pulses.  Abdomen: Soft, nontender, no hepatosplenomegaly, no distention.  Skin: Intact without lesions or rashes.  Neurologic: Alert and oriented x 3. Mild tremor.  Psych: Normal affect. Extremities: No clubbing or cyanosis.   Assessment/Plan: 1. CAD risk: Patient is doing well in general.  He does have a family history of CAD.  He had a coronary artery calcium score of 0 in 2013.  He will continue ASA 81 daily and statin.   2. Hyperlipidemia:  Excellent lipids this month.   I will see him back in a year.  Loralie Champagne 04/08/2015

## 2015-04-11 ENCOUNTER — Telehealth: Payer: Self-pay | Admitting: *Deleted

## 2015-04-11 DIAGNOSIS — E785 Hyperlipidemia, unspecified: Secondary | ICD-10-CM

## 2015-04-11 DIAGNOSIS — R748 Abnormal levels of other serum enzymes: Secondary | ICD-10-CM

## 2015-04-11 NOTE — Telephone Encounter (Signed)
Notes Recorded by Larey Dresser, MD on 04/09/2015   Good lipids but very slight AST elevation, repeat LFTs 1 month.

## 2015-05-02 ENCOUNTER — Encounter: Payer: Self-pay | Admitting: Cardiology

## 2015-05-10 ENCOUNTER — Telehealth: Payer: Self-pay | Admitting: Internal Medicine

## 2015-05-10 ENCOUNTER — Other Ambulatory Visit: Payer: 59

## 2015-05-10 NOTE — Telephone Encounter (Signed)
Is requesting to transfer from [place] to [place].  States that Dr. Ronnald Ramp was recommended by another physician.

## 2015-05-11 NOTE — Telephone Encounter (Signed)
Ok with me 

## 2015-05-11 NOTE — Telephone Encounter (Signed)
Left vm to call back to schedule  °

## 2015-05-15 ENCOUNTER — Other Ambulatory Visit (INDEPENDENT_AMBULATORY_CARE_PROVIDER_SITE_OTHER): Payer: 59 | Admitting: *Deleted

## 2015-05-15 DIAGNOSIS — E785 Hyperlipidemia, unspecified: Secondary | ICD-10-CM

## 2015-05-15 DIAGNOSIS — R748 Abnormal levels of other serum enzymes: Secondary | ICD-10-CM | POA: Diagnosis not present

## 2015-05-15 LAB — HEPATIC FUNCTION PANEL
ALBUMIN: 3.9 g/dL (ref 3.6–5.1)
ALT: 28 U/L (ref 9–46)
AST: 34 U/L (ref 10–35)
Alkaline Phosphatase: 36 U/L — ABNORMAL LOW (ref 40–115)
BILIRUBIN TOTAL: 0.6 mg/dL (ref 0.2–1.2)
Bilirubin, Direct: 0.2 mg/dL (ref <=0.2)
Indirect Bilirubin: 0.4 mg/dL (ref 0.2–1.2)
Total Protein: 6.2 g/dL (ref 6.1–8.1)

## 2015-05-16 ENCOUNTER — Encounter: Payer: Self-pay | Admitting: *Deleted

## 2015-05-16 ENCOUNTER — Telehealth: Payer: Self-pay | Admitting: Cardiology

## 2015-05-16 NOTE — Telephone Encounter (Signed)
LFTs are normal now, would just stay the course.  No way to know whether a different statin would have less effect on liver, generally statins have minimal liver effect.  I suspect that the small rise in LFTs before was not statin-related, especially as it has resolved.

## 2015-05-16 NOTE — Telephone Encounter (Signed)
Responded by MyChart at pt's request.

## 2015-05-16 NOTE — Telephone Encounter (Signed)
New Message   Pt calling back for rn

## 2015-05-16 NOTE — Telephone Encounter (Signed)
Spoke with patient about recent liver profile results. Pt asking if Dr Aundra Dubin feels a different statin medication would be less likely to have an affect on liver.  Pt advised I will forward to Dr Aundra Dubin for review.

## 2015-06-09 ENCOUNTER — Ambulatory Visit: Payer: 59 | Admitting: Cardiology

## 2015-09-18 ENCOUNTER — Encounter: Payer: Self-pay | Admitting: Internal Medicine

## 2015-09-18 ENCOUNTER — Other Ambulatory Visit (INDEPENDENT_AMBULATORY_CARE_PROVIDER_SITE_OTHER): Payer: 59

## 2015-09-18 ENCOUNTER — Ambulatory Visit (INDEPENDENT_AMBULATORY_CARE_PROVIDER_SITE_OTHER): Payer: 59 | Admitting: Internal Medicine

## 2015-09-18 VITALS — BP 122/74 | HR 69 | Temp 98.3°F | Resp 16 | Ht 67.0 in | Wt 163.0 lb

## 2015-09-18 DIAGNOSIS — R631 Polydipsia: Secondary | ICD-10-CM

## 2015-09-18 DIAGNOSIS — J0100 Acute maxillary sinusitis, unspecified: Secondary | ICD-10-CM

## 2015-09-18 DIAGNOSIS — E785 Hyperlipidemia, unspecified: Secondary | ICD-10-CM | POA: Diagnosis not present

## 2015-09-18 LAB — CBC WITH DIFFERENTIAL/PLATELET
BASOS PCT: 0.6 % (ref 0.0–3.0)
Basophils Absolute: 0 10*3/uL (ref 0.0–0.1)
EOS ABS: 0.1 10*3/uL (ref 0.0–0.7)
Eosinophils Relative: 1.5 % (ref 0.0–5.0)
HEMATOCRIT: 40.9 % (ref 39.0–52.0)
Hemoglobin: 14 g/dL (ref 13.0–17.0)
LYMPHS PCT: 22.5 % (ref 12.0–46.0)
Lymphs Abs: 1.6 10*3/uL (ref 0.7–4.0)
MCHC: 34.2 g/dL (ref 30.0–36.0)
MCV: 91.2 fl (ref 78.0–100.0)
MONOS PCT: 8.1 % (ref 3.0–12.0)
Monocytes Absolute: 0.6 10*3/uL (ref 0.1–1.0)
NEUTROS ABS: 4.9 10*3/uL (ref 1.4–7.7)
Neutrophils Relative %: 67.3 % (ref 43.0–77.0)
PLATELETS: 192 10*3/uL (ref 150.0–400.0)
RBC: 4.48 Mil/uL (ref 4.22–5.81)
RDW: 12.9 % (ref 11.5–15.5)
WBC: 7.3 10*3/uL (ref 4.0–10.5)

## 2015-09-18 LAB — URINALYSIS, ROUTINE W REFLEX MICROSCOPIC
Bilirubin Urine: NEGATIVE
KETONES UR: NEGATIVE
Leukocytes, UA: NEGATIVE
NITRITE: NEGATIVE
Specific Gravity, Urine: <=1.005 — AB (ref 1.000–1.030)
Total Protein, Urine: NEGATIVE
URINE GLUCOSE: NEGATIVE
Urobilinogen, UA: 0.2 (ref 0.0–1.0)
WBC UA: NONE SEEN (ref 0–?)
pH: 6 (ref 5.0–8.0)

## 2015-09-18 LAB — BASIC METABOLIC PANEL
BUN: 15 mg/dL (ref 6–23)
CHLORIDE: 100 meq/L (ref 96–112)
CO2: 29 meq/L (ref 19–32)
CREATININE: 0.88 mg/dL (ref 0.40–1.50)
Calcium: 9.4 mg/dL (ref 8.4–10.5)
GFR: 90.49 mL/min (ref >60.00)
Glucose, Bld: 99 mg/dL (ref 70–99)
Potassium: 4 mEq/L (ref 3.5–5.1)
Sodium: 134 mEq/L — ABNORMAL LOW (ref 135–145)

## 2015-09-18 MED ORDER — HYDROCODONE-HOMATROPINE 5-1.5 MG/5ML PO SYRP: 5.0000 mL | ORAL_SOLUTION | Freq: Three times a day (TID) | ORAL | Status: DC | PRN

## 2015-09-18 MED ORDER — AMOXICILLIN 875 MG PO TABS: 875.0000 mg | ORAL_TABLET | Freq: Two times a day (BID) | ORAL | Status: AC

## 2015-09-18 NOTE — Patient Instructions (Signed)

## 2015-09-18 NOTE — Progress Notes (Signed)
Subjective:  Patient ID: Adam Gaines, male    DOB: May 22, 1946  Age: 70 y.o. MRN: [id number]  CC: Sinusitis   HPI DANIAL RUEFF presents for a one-week history of sore throat, nonproductive cough that is severe at night, postnasal drip, facial pain, thick yellow nasal phlegm and chills but no fever.  He also complains of increased thirst over the last few days.  Outpatient Prescriptions Prior to Visit  Medication Sig Dispense Refill  . aspirin 81 MG tablet Take 81 mg by mouth daily.      Marland Kitchen atorvastatin (LIPITOR) 20 MG tablet Take 1 tablet by mouth  daily 90 tablet 2  . b complex vitamins tablet Take 1 tablet by mouth daily.    . Cholecalciferol (VITAMIN D) 2000 UNITS tablet Take 1 tablet (2,000 Units total) by mouth daily. 30 tablet 11   No facility-administered medications prior to visit.    ROS Review of Systems  Constitutional: Positive for chills. Negative for fever, diaphoresis, activity change, appetite change and fatigue.  HENT: Positive for congestion, postnasal drip, rhinorrhea, sinus pressure and sore throat. Negative for facial swelling, sneezing, tinnitus, trouble swallowing and voice change.   Eyes: Negative.  Negative for visual disturbance.  Respiratory: Positive for cough. Negative for apnea, choking, chest tightness, shortness of breath, wheezing and stridor.   Cardiovascular: Negative.  Negative for chest pain, palpitations and leg swelling.  Gastrointestinal: Negative.  Negative for nausea, vomiting, abdominal pain, diarrhea, constipation and blood in stool.  Endocrine: Positive for polydipsia. Negative for cold intolerance, heat intolerance, polyphagia and polyuria.  Genitourinary: Negative.  Negative for urgency, frequency, decreased urine volume and difficulty urinating.  Musculoskeletal: Negative.   Skin: Negative.  Negative for color change.  Allergic/Immunologic: Negative.   Neurological: Negative.  Negative for dizziness, weakness, light-headedness,  numbness and headaches.  Hematological: Negative.  Negative for adenopathy. Does not bruise/bleed easily.  Psychiatric/Behavioral: Negative.     Objective:  BP 122/74 mmHg  Pulse 69  Temp(Src) 98.3 F (36.8 C) (Oral)  Resp 16  Ht 5\' 7"  (1.702 m)  Wt 163 lb (73.936 kg)  BMI 25.52 kg/m2  SpO2 97%  BP Readings from Last 3 Encounters:  09/18/15 122/74  04/06/15 118/70  11/09/14 126/78    Wt Readings from Last 3 Encounters:  09/18/15 163 lb (73.936 kg)  04/06/15 164 lb 12.8 oz (74.753 kg)  11/09/14 164 lb (74.39 kg)    Physical Exam  Constitutional: He is oriented to person, place, and time.  Non-toxic appearance. He does not have a sickly appearance. He does not appear ill. No distress.  HENT:  Nose: Rhinorrhea present. No mucosal edema or sinus tenderness. Right sinus exhibits maxillary sinus tenderness. Right sinus exhibits no frontal sinus tenderness. Left sinus exhibits no maxillary sinus tenderness and no frontal sinus tenderness.  Mouth/Throat: Mucous membranes are normal. Mucous membranes are not pale, not dry and not cyanotic. Posterior oropharyngeal erythema present. No oropharyngeal exudate, posterior oropharyngeal edema or tonsillar abscesses.  Eyes: Conjunctivae are normal. Right eye exhibits no discharge. Left eye exhibits no discharge. No scleral icterus.  Neck: Normal range of motion. Neck supple. No JVD present. No tracheal deviation present. No thyromegaly present.  Cardiovascular: Normal rate, regular rhythm, normal heart sounds and intact distal pulses.  Exam reveals no gallop and no friction rub.   No murmur heard. Pulmonary/Chest: Effort normal and breath sounds normal. No stridor. No respiratory distress. He has no wheezes. He has no rales. He exhibits no tenderness.  Abdominal:  Soft. Bowel sounds are normal. He exhibits no distension and no mass. There is no tenderness. There is no rebound and no guarding.  Musculoskeletal: Normal range of motion. He exhibits  no edema or tenderness.  Lymphadenopathy:    He has no cervical adenopathy.  Neurological: He is oriented to person, place, and time.  Skin: Skin is warm and dry. No rash noted. He is not diaphoretic. No erythema. No pallor.  Vitals reviewed.   Lab Results  Component Value Date   WBC 7.3 09/18/2015   HGB 14.0 09/18/2015   HCT 40.9 09/18/2015   PLT 192.0 09/18/2015   GLUCOSE 99 09/18/2015   CHOL 172 04/05/2015   TRIG 62 04/05/2015   HDL 84 04/05/2015   LDLCALC 76 04/05/2015   ALT 28 05/15/2015   AST 34 05/15/2015   NA 134* 09/18/2015   K 4.0 09/18/2015   CL 100 09/18/2015   CREATININE 0.88 09/18/2015   BUN 15 09/18/2015   CO2 29 09/18/2015   TSH 1.69 10/26/2014   PSA 3.14 09/12/2014   HGBA1C 5.3 11/09/2014    Ct Cardiac Scoring  03/12/2012  **ADDENDUM** CREATED: 03/12/2012 18:04:37 CARDIAC CTA WITH CALCIUM SCORE 03/11/2012 15:18:00 Ordering Physician: DALTONMCLEAN Reading Physician: DaltonS.Mclean Protocol:  The patient scanned on a Siemens 16 slice scanner. After an initial AP and lateral topogram, 3 mm axial slices were performed through the heart for calcium scoring. Indications: CAD risk factors DETAILED FINDINGS: Quality of Study: Good Coronary Calcium Score: 0 Noncardiac findings to be read by [facility]. IMPRESSION: Coronary artery calcium score of 0, placing the patient in a low risk category for future cardiovascular events. **END ADDENDUM** SIGNED BY: Dalton S. Mclean  03/11/2012  OVER-READ INTERPRETATION - CT CHEST The following report is an over-read performed by radiologist Dr. Karn Cassis. Reche Dixon, M.D. of [facility], [place] on 03/11/2012 16:42:08.  This over-read does not include interpretation of cardiac or coronary anatomy or pathology.  The coronary calcium score interpretation by the cardiologist is attached. Findings: Lung windows demonstrate minimal scarring at the left lung base. Soft tissue windows demonstrate ascending aortic size upper normal, 3.8 cm  on image 1 of series 5.  Incompletely imaged. No pericardial or pleural effusion.  No imaged thoracic adenopathy within the included chest.  No pericardial or pleural effusion. Limited abdominal imaging demonstrates right liver lobe lesion which measures 1.0 cm and is relatively well circumscribed and fluid density on image 37.  A sub centimeter low density hepatic dome lesion on image 34 which is also favored to represent a small cyst.  Prominent osteophytes within the thoracic spine. IMPRESSION 1.  No acute extracardiac findings within the chest. 2.  Incompletely imaged borderline dilated ascending aorta. 3.  2 low density liver lesions. Most likely cysts.  If there is any history of primary malignancy, recommend correlation with prior cross-sectional imaging and consideration of pre and post contrast abdominal MRI. Coronary Calcium Score: Indication: Risk stratification The patient was scanned on a Siemens Sensation 16 slice scanner. Noncontrast axial slices were done through the heart.  Calicum scoring was done on a dedicated work station using the [employer]  Findings: Coronary calcium score 0 Upper limits of normal ascending aortic root 3.8 cm See separate report from Dr Jeronimo Greaves regarding noncardiac findings and possible liver cysts. Charlton Haws MD [facility] Original Report Authenticated By: Gretta Began    Assessment & Plan:   Ishaq was seen today for sinusitis.  Diagnoses and all orders for this visit:  Hyperlipidemia  Acute  maxillary sinusitis, recurrence not specified- will treat the infection with amoxicillin and control his symptoms with Hycodan -     amoxicillin (AMOXIL) 875 MG tablet; Take 1 tablet (875 mg total) by mouth 2 (two) times daily. -     HYDROcodone-homatropine (HYCODAN) 5-1.5 MG/5ML syrup; Take 5 mLs by mouth every 8 (eight) hours as needed for cough. 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 -     Basic metabolic panel; Future -     CBC with Differential/Platelet; Future -     Urinalysis, Routine w reflex microscopic (not at [facility]); Future   I am having Mr. Kurek start on amoxicillin and HYDROcodone-homatropine. I am also having him maintain his aspirin, Vitamin D, b complex vitamins, and atorvastatin.  Meds ordered this encounter  Medications  . amoxicillin (AMOXIL) 875 MG tablet    Sig: Take 1 tablet (875 mg total) by mouth 2 (two) times daily.    Dispense:  20 tablet    Refill:  0  . HYDROcodone-homatropine (HYCODAN) 5-1.5 MG/5ML syrup    Sig: Take 5 mLs by mouth every 8 (eight) hours as needed for cough.    Dispense:  120 mL    Refill:  0     Follow-up: Return in about 3 weeks (around 10/09/2015).  Sanda Linger, MD

## 2015-09-18 NOTE — Progress Notes (Signed)
Pre visit review using our clinic review tool, if applicable. No additional management support is needed unless otherwise documented below in the visit note. 

## 2015-09-19 ENCOUNTER — Encounter: Payer: Self-pay | Admitting: Internal Medicine

## 2015-09-20 LAB — PSA: PSA: 4.47

## 2015-10-05 ENCOUNTER — Encounter: Payer: Self-pay | Admitting: Internal Medicine

## 2015-11-16 ENCOUNTER — Other Ambulatory Visit (INDEPENDENT_AMBULATORY_CARE_PROVIDER_SITE_OTHER): Payer: 59

## 2015-11-16 ENCOUNTER — Ambulatory Visit (INDEPENDENT_AMBULATORY_CARE_PROVIDER_SITE_OTHER): Payer: 59 | Admitting: Internal Medicine

## 2015-11-16 ENCOUNTER — Encounter: Payer: Self-pay | Admitting: Internal Medicine

## 2015-11-16 VITALS — BP 122/78 | HR 70 | Temp 98.3°F | Resp 16 | Ht 67.0 in | Wt 164.0 lb

## 2015-11-16 DIAGNOSIS — Z1159 Encounter for screening for other viral diseases: Secondary | ICD-10-CM

## 2015-11-16 DIAGNOSIS — E785 Hyperlipidemia, unspecified: Secondary | ICD-10-CM

## 2015-11-16 DIAGNOSIS — Z0000 Encounter for general adult medical examination without abnormal findings: Secondary | ICD-10-CM

## 2015-11-16 LAB — LIPID PANEL
CHOL/HDL RATIO: 2
CHOLESTEROL: 192 mg/dL (ref 0–200)
HDL: 87.5 mg/dL (ref >39.00)
LDL Cholesterol: 94 mg/dL (ref 0–99)
NonHDL: 104.69
Triglycerides: 53 mg/dL (ref 0.0–149.0)
VLDL: 10.6 mg/dL (ref 0.0–40.0)

## 2015-11-16 LAB — HEPATITIS C ANTIBODY: HCV Ab: NEGATIVE

## 2015-11-16 LAB — TSH: TSH: 1.42 u[IU]/mL (ref 0.35–4.50)

## 2015-11-16 NOTE — Progress Notes (Signed)
Subjective:  Patient ID: Adam Gaines, male    DOB: 02/14/1946  Age: 70 y.o. MRN: [id number]  CC: Annual Exam and Hyperlipidemia   HPI Adam Gaines presents for a CPX.  He is due for follow-up on his lipid levels. He has a history of hypercholesterolemia but no complications such as coronary artery disease or stroke. He is taking atorvastatin and has not experienced any side effects such as muscle or joint aches. He is being monitored for a slightly elevated PSA and tells me he saw his urologist about 3 months ago.  History Adam Gaines has a past medical history of Personal history of other disorder of urinary system; Lumbago; Chronic eczema; Chickenpox; Other and unspecified hyperlipidemia; Colon polyps; Diverticulosis; Intestinal angiodysplasia; Benign essential tremor (11/18/2011); Cardiovascular risk factor (03/08/2012); and TONSILLECTOMY AND ADENOIDECTOMY, HX OF (07/24/2007).   He has past surgical history that includes repair of deviated septum; left L5 hemilaminotomy with microdiskectomy; and Tonsillectomy.   His family history includes COPD in his mother; Coronary artery disease in his father and mother; Diabetes in his maternal grandmother and paternal grandmother; Heart attack in his father and mother; Heart disease in his father and mother; Hyperlipidemia in his father; Hypertension in his father; Other in his other.He reports that he quit smoking about 37 years ago. He has never used smokeless tobacco. He reports that he drinks about 1.2 oz of alcohol per week. He reports that he does not use illicit drugs.  Outpatient Prescriptions Prior to Visit  Medication Sig Dispense Refill  . aspirin 81 MG tablet Take 81 mg by mouth daily.      Marland Kitchen atorvastatin (LIPITOR) 20 MG tablet Take 1 tablet by mouth  daily 90 tablet 2  . b complex vitamins tablet Take 1 tablet by mouth daily.    . Cholecalciferol (VITAMIN D) 2000 UNITS tablet Take 1 tablet (2,000 Units total) by mouth daily. 30 tablet  11  . HYDROcodone-homatropine (HYCODAN) 5-1.5 MG/5ML syrup Take 5 mLs by mouth every 8 (eight) hours as needed for cough. 120 mL 0   No facility-administered medications prior to visit.    ROS Review of Systems  Constitutional: Negative.  Negative for fever, chills, diaphoresis, appetite change and fatigue.  HENT: Negative.   Eyes: Negative.  Negative for visual disturbance.  Respiratory: Negative.  Negative for cough, choking, chest tightness, shortness of breath and stridor.   Cardiovascular: Negative.  Negative for chest pain, palpitations and leg swelling.  Gastrointestinal: Negative.  Negative for nausea, vomiting, abdominal pain, diarrhea and constipation.  Endocrine: Negative.   Genitourinary: Negative.  Negative for difficulty urinating.  Musculoskeletal: Negative.  Negative for myalgias, back pain, joint swelling and arthralgias.  Skin: Negative.   Allergic/Immunologic: Negative.   Neurological: Negative.  Negative for dizziness, tremors, syncope, weakness and numbness.  Hematological: Negative.  Negative for adenopathy. Does not bruise/bleed easily.  Psychiatric/Behavioral: Negative.     Objective:  BP 122/78 mmHg  Pulse 70  Temp(Src) 98.3 F (36.8 C) (Oral)  Resp 16  Ht 5\' 7"  (1.702 m)  Wt 164 lb (74.39 kg)  BMI 25.68 kg/m2  SpO2 96%  Physical Exam  Constitutional: He is oriented to person, place, and time. He appears well-developed and well-nourished. No distress.  HENT:  Head: Normocephalic and atraumatic.  Mouth/Throat: Oropharynx is clear and moist. No oropharyngeal exudate.  Eyes: Conjunctivae are normal. Right eye exhibits no discharge. Left eye exhibits no discharge. No scleral icterus.  Neck: Normal range of motion. Neck supple. No JVD  present. No tracheal deviation present. No thyromegaly present.  Cardiovascular: Normal rate, regular rhythm, normal heart sounds and intact distal pulses.  Exam reveals no gallop and no friction rub.   No murmur  heard. Pulmonary/Chest: Effort normal and breath sounds normal. No stridor. No respiratory distress. He has no wheezes. He has no rales. He exhibits no tenderness.  Abdominal: Soft. Bowel sounds are normal. He exhibits no distension and no mass. There is no tenderness. There is no rebound and no guarding.  Genitourinary:  GU and rectal exam was deferred at his request since he tells me his urologist did it about 3 months ago  Musculoskeletal: Normal range of motion. He exhibits no edema or tenderness.  Lymphadenopathy:    He has no cervical adenopathy.  Neurological: He is oriented to person, place, and time.  Skin: Skin is warm and dry. No rash noted. He is not diaphoretic. No erythema. No pallor.  Vitals reviewed.   Lab Results  Component Value Date   WBC 7.3 09/18/2015   HGB 14.0 09/18/2015   HCT 40.9 09/18/2015   PLT 192.0 09/18/2015   GLUCOSE 99 09/18/2015   CHOL 192 11/16/2015   TRIG 53.0 11/16/2015   HDL 87.50 11/16/2015   LDLCALC 94 11/16/2015   ALT 28 05/15/2015   AST 34 05/15/2015   NA 134* 09/18/2015   K 4.0 09/18/2015   CL 100 09/18/2015   CREATININE 0.88 09/18/2015   BUN 15 09/18/2015   CO2 29 09/18/2015   TSH 1.42 11/16/2015   PSA 4.47 09/20/2015   HGBA1C 5.3 11/09/2014    Assessment & Plan:   Adam Gaines was seen today for annual exam and hyperlipidemia.  Diagnoses and all orders for this visit:  Hyperlipidemia with target LDL less than 100- he has achieved his LDL goal is doing well on statin therapy. -     Lipid panel; Future -     TSH; Future  Need for hepatitis C screening test -     Hepatitis C antibody; Future  Routine general medical examination at a health care facility   I have discontinued Adam Gaines HYDROcodone-homatropine. I am also having him maintain his aspirin, Vitamin D, b complex vitamins, and atorvastatin.  No orders of the defined types were placed in this encounter.   See AVS for instructions about healthy living and  anticipatory guidance.  Follow-up: Return if symptoms worsen or fail to improve.  Sanda Linger, MD

## 2015-11-16 NOTE — Patient Instructions (Signed)

## 2015-11-16 NOTE — Progress Notes (Signed)
Pre visit review using our clinic review tool, if applicable. No additional management support is needed unless otherwise documented below in the visit note. 

## 2015-11-17 ENCOUNTER — Encounter: Payer: Self-pay | Admitting: Internal Medicine

## 2015-11-20 DIAGNOSIS — Z0000 Encounter for general adult medical examination without abnormal findings: Secondary | ICD-10-CM | POA: Insufficient documentation

## 2015-11-20 NOTE — Assessment & Plan Note (Signed)

## 2016-02-11 ENCOUNTER — Other Ambulatory Visit: Payer: Self-pay | Admitting: Cardiology

## 2016-02-11 DIAGNOSIS — E785 Hyperlipidemia, unspecified: Secondary | ICD-10-CM

## 2016-05-02 ENCOUNTER — Other Ambulatory Visit: Payer: Self-pay | Admitting: Cardiology

## 2016-05-02 DIAGNOSIS — E785 Hyperlipidemia, unspecified: Secondary | ICD-10-CM

## 2016-05-16 ENCOUNTER — Other Ambulatory Visit: Payer: Self-pay | Admitting: Cardiology

## 2016-05-16 DIAGNOSIS — E785 Hyperlipidemia, unspecified: Secondary | ICD-10-CM

## 2016-05-17 ENCOUNTER — Other Ambulatory Visit: Payer: Self-pay | Admitting: Cardiology

## 2016-05-17 DIAGNOSIS — E785 Hyperlipidemia, unspecified: Secondary | ICD-10-CM

## 2016-05-17 MED ORDER — ATORVASTATIN CALCIUM 20 MG PO TABS: 20.0000 mg | ORAL_TABLET | Freq: Every day | ORAL | 0 refills | Status: DC

## 2016-05-23 ENCOUNTER — Encounter: Payer: Self-pay | Admitting: *Deleted

## 2016-06-11 NOTE — Progress Notes (Signed)
Follow-up Outpatient Visit Date: 06/14/2016  Chief Complaint: Follow-up hyperlipidemia  HPI:  Mr. Konkel is a 71 y.o. year-old male with history of HLD and family history of CAD, who presents for follow-up of these issues. He was previously followed in our office by Dr. Aundra Dubin, having last been seen on 04/06/15. Since that time, Mr. Kusnierz has been feeling well without chest pain. He has experienced shortness of breath when he first starts walking or exercising, though this resolves after a few minutes of continuing to exert himself. This has been present for years. He denies orthopnea and PND.  Mr. Killion continues to exercise at least 2 days per week.  His biggest limitation is joint stiffness.  He has noticed a slight tremor over the last few years.  He was seen in 2016 by neurology with unrevealing workup.  Tremor has been stable. He denies palpitations. Mr. Ghattas notes that his feet at times feel cold, particularly in the winter. This seems to be accompanied by mild ankle swelling. He does not have pain in his legs with walking. He continues to drink about 3 cups of caffeinated coffee per day, as well as a glass of wine 3 days a week. He is tolerating low-dose aspirin and atorvastatin well.  --------------------------------------------------------------------------------------------------  Cardiovascular History & Procedures: Cardiovascular Problems:  Hyperlipidemia and family history of coronary artery disease  Risk Factors:  Hyperlipidemia, male gender, age greater then 64, and family history of coronary artery disease  Cath/PCI:  None  CV Surgery:  None  EP Procedures and Devices:  None  Non-Invasive Evaluation(s):  Coronary Calcium Score (03/11/12): CCS = 0.  Borderline dilated ascending aorta.  Two low-density lesions in the liver; ? Cysts.  Exercise tolerance test (04/2008): Normal per Dr. Claris Gladden note.  Recent CV Pertinent Labs: Lab Results  Component Value Date     CHOL 192 11/16/2015   CHOL 203 (H) 05/09/2014   HDL 87.50 11/16/2015   HDL 85 05/09/2014   LDLCALC 94 11/16/2015   LDLCALC 97 05/09/2014   TRIG 53.0 11/16/2015   TRIG 106 05/09/2014   CHOLHDL 2 11/16/2015   K 4.0 09/18/2015   BUN 15 09/18/2015   CREATININE 0.88 09/18/2015    Past medical and surgical history were reviewed and updated in EPIC.   Outpatient Encounter Prescriptions as of 06/14/2016  Medication Sig  . aspirin 81 MG tablet Take 81 mg by mouth daily.    Marland Kitchen atorvastatin (LIPITOR) 20 MG tablet Take 1 tablet (20 mg total) by mouth daily.  Marland Kitchen b complex vitamins tablet Take 1 tablet by mouth daily.  . Cholecalciferol (VITAMIN D) 2000 UNITS tablet Take 1 tablet (2,000 Units total) by mouth daily.  . TURMERIC PO Take 1 capsule by mouth daily.   No facility-administered encounter medications on file as of 06/14/2016.     Allergies: Patient has no known allergies.  Social History   Social History  . Marital status: Married    Spouse name: N/A  . Number of children: 2  . Years of education: 61   Occupational History  . 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 Smoking status: Former Smoker    Quit date: 11/28/1978  . Smokeless tobacco: Never Used  . Alcohol use 1.2 oz/week    2 Standard drinks or equivalent per week     Comment: social  . Drug use: No  . Sexual activity: Yes    Partners: Female  Other Topics Concern  . Not on file   Social History Narrative   HS[facility], [place] [place] JD '70. [employer] '70-'74. Marriage '67 and  in good health. 2 dtrs - '69, '72; 6 grandchildren. Work - Furniture conservator/restorer.                         Family History  Problem Relation Age of Onset  . Heart attack Mother 89    after aspiration  . Coronary artery disease Mother   . Heart disease Mother   . COPD Mother     smoker  . Coronary artery disease Father   . Heart disease Father   .  Hyperlipidemia Father   . Hypertension Father   . Heart attack Father 84  . Diabetes Maternal Grandmother   . Other Other     cardiovascular disease  . Diabetes Paternal Grandmother     Review of Systems: A 12-system review of systems was performed and was negative except as noted in the HPI.  --------------------------------------------------------------------------------------------------  Physical Exam: BP 130/74   Pulse 69   Ht 5\' 7"  (1.702 m)   Wt 163 lb 9.6 oz (74.2 kg)   BMI 25.62 kg/m   General:  Well-developed, well-nourished man, seated comfortably in the exam room. HEENT: No conjunctival pallor or scleral icterus.  Moist mucous membranes.  OP clear. Neck: Supple without lymphadenopathy, thyromegaly, JVD, or HJR.  No carotid bruit. Lungs: Normal work of breathing.  Clear to auscultation bilaterally without wheezes or crackles. Heart: Regular rate and rhythm without murmurs, rubs, or gallops.  Non-displaced PMI. Abd: Bowel sounds present.  Soft, NT/ND without hepatosplenomegaly Ext: Trace pretibial edema bilaterally.  Radial, PT, and DP pulses are 2+ bilaterally.  Varicose veins noted along left calf. Skin: warm and dry.  Dry skin with scattered erythematous macules suggestive of venous stasis.  EKG:  NSR.  No significant abnormalities.  Lab Results  Component Value Date   WBC 7.3 09/18/2015   HGB 14.0 09/18/2015   HCT 40.9 09/18/2015   MCV 91.2 09/18/2015   PLT 192.0 09/18/2015    Lab Results  Component Value Date   NA 134 (L) 09/18/2015   K 4.0 09/18/2015   CL 100 09/18/2015   CO2 29 09/18/2015   BUN 15 09/18/2015   CREATININE 0.88 09/18/2015   GLUCOSE 99 09/18/2015   ALT 28 05/15/2015    Lab Results  Component Value Date   CHOL 192 11/16/2015   HDL 87.50 11/16/2015   LDLCALC 94 11/16/2015   TRIG 53.0 11/16/2015   CHOLHDL 2 11/16/2015     --------------------------------------------------------------------------------------------------  ASSESSMENT AND PLAN: Dyspnea on exertion and lower extremity edema Mild lower extremity edema is noted on exam today. Findings are most suggestive of venous insufficiency. However, given the patient's report of mild exertional dyspnea, we have agreed to obtain a transthoracic echocardiogram to evaluate for underlying structural heart abnormality. Given that his symptoms have been stable for years and his exam and EKG today are normal, I suspect that we will not find a major abnormality. I have encouraged Mr. Purchase to consider wearing compression stockings to help with his venous insufficiency.  Hyperlipidemia Mr. Busbin is tolerating moderate intensity statin therapy well. Most recent lipid panel in 11/2015 showed total cholesterol 192, HDL of 88, and LDL of 94, which is quite good. We will not make any medication changes today.  Family history of coronary artery  disease Both the patient's parents had heart attacks, including his father in his early 72s. Mr. Avella underwent exercise tolerance test in 2009 that was normal. Coronary calcium score 2013 showed no coronary calcium. Given his family history, it is reasonable to continue with statin therapy and low-dose aspirin for primary prevention. His mild exertional dyspnea is stable. We will not perform any further ischemia testing unless he experiences worsening dyspnea, develops chest pain, or has evidence of cardiomyopathy/wall motion abnormality on echocardiogram. Of note, EKG today is normal.  Follow-up: Return to clinic in 1 year.  Nelva Bush, MD 06/14/2016 8:20 AM

## 2016-06-13 DIAGNOSIS — H2513 Age-related nuclear cataract, bilateral: Secondary | ICD-10-CM | POA: Diagnosis not present

## 2016-06-14 ENCOUNTER — Ambulatory Visit (INDEPENDENT_AMBULATORY_CARE_PROVIDER_SITE_OTHER): Payer: 59 | Admitting: Internal Medicine

## 2016-06-14 ENCOUNTER — Encounter: Payer: Self-pay | Admitting: Internal Medicine

## 2016-06-14 VITALS — BP 130/74 | HR 69 | Ht 67.0 in | Wt 163.6 lb

## 2016-06-14 DIAGNOSIS — R0609 Other forms of dyspnea: Secondary | ICD-10-CM | POA: Diagnosis not present

## 2016-06-14 DIAGNOSIS — R600 Localized edema: Secondary | ICD-10-CM | POA: Diagnosis not present

## 2016-06-14 DIAGNOSIS — Z8249 Family history of ischemic heart disease and other diseases of the circulatory system: Secondary | ICD-10-CM

## 2016-06-14 DIAGNOSIS — E7800 Pure hypercholesterolemia, unspecified: Secondary | ICD-10-CM

## 2016-06-14 MED ORDER — ATORVASTATIN CALCIUM 20 MG PO TABS: 20.0000 mg | ORAL_TABLET | Freq: Every day | ORAL | 3 refills | Status: DC

## 2016-06-14 NOTE — Patient Instructions (Signed)
Medication Instructions:  Your physician recommends that you continue on your current medications as directed. Please refer to the Current Medication list given to you today.   Labwork: None   Testing/Procedures: Your physician has requested that you have an echocardiogram. Echocardiography is a painless test that uses sound waves to create images of your heart. It provides your doctor with information about the size and shape of your heart and how well your heart's chambers and valves are working. This procedure takes approximately one hour. There are no restrictions for this procedure.     Follow-Up: Your physician wants you to follow-up in: 1 year with Dr End. (January 2019). You will receive a reminder letter in the mail two months in advance. If you don't receive a letter, please call our office to schedule the follow-up appointment.        If you need a refill on your cardiac medications before your next appointment, please call your pharmacy.

## 2016-06-28 ENCOUNTER — Ambulatory Visit (HOSPITAL_COMMUNITY): Payer: 59 | Attending: Cardiovascular Disease

## 2016-06-28 ENCOUNTER — Other Ambulatory Visit: Payer: Self-pay

## 2016-06-28 DIAGNOSIS — I351 Nonrheumatic aortic (valve) insufficiency: Secondary | ICD-10-CM

## 2016-06-28 DIAGNOSIS — R0609 Other forms of dyspnea: Secondary | ICD-10-CM

## 2016-06-28 DIAGNOSIS — I5189 Other ill-defined heart diseases: Secondary | ICD-10-CM

## 2016-06-28 DIAGNOSIS — I517 Cardiomegaly: Secondary | ICD-10-CM

## 2016-06-28 DIAGNOSIS — R600 Localized edema: Secondary | ICD-10-CM

## 2016-06-28 HISTORY — DX: Other ill-defined heart diseases: I51.89

## 2016-06-28 HISTORY — DX: Nonrheumatic aortic (valve) insufficiency: I35.1

## 2016-06-28 HISTORY — DX: Cardiomegaly: I51.7

## 2016-07-01 ENCOUNTER — Ambulatory Visit (AMBULATORY_SURGERY_CENTER): Payer: Self-pay | Admitting: *Deleted

## 2016-07-01 VITALS — Ht 67.0 in | Wt 166.0 lb

## 2016-07-01 DIAGNOSIS — Z860100 Personal history of colon polyps, unspecified: Secondary | ICD-10-CM

## 2016-07-01 MED ORDER — NA SULFATE-K SULFATE-MG SULF 17.5-3.13-1.6 GM/177ML PO SOLN: ORAL | 0 refills | Status: DC

## 2016-07-01 NOTE — Progress Notes (Signed)
Patient denies any allergies to eggs or soy. Patient denies any problems with anesthesia/sedation. Patient denies any oxygen use at home and does not take any diet/weight loss medications. EMMI education declined by patient.  

## 2016-07-02 ENCOUNTER — Telehealth: Payer: Self-pay | Admitting: Internal Medicine

## 2016-07-02 NOTE — Telephone Encounter (Signed)
I spoke with [name] regarding the results of his echo, which demonstrated normal LVEF with diastolic dysfunction and mild aortic regurgitation. Given his lack of symptoms, we will continue to follow him clinically and consider repeating an echo in a few years. He should contact us if he develops new problems including shortness of breath or edema. Otherwise, we will follow-up as planned in one year.  Nelva Bush, MD [facility] Pager: [phone]

## 2016-07-05 ENCOUNTER — Encounter: Payer: Self-pay | Admitting: Internal Medicine

## 2016-07-19 ENCOUNTER — Encounter: Payer: Self-pay | Admitting: Internal Medicine

## 2016-07-19 ENCOUNTER — Ambulatory Visit (AMBULATORY_SURGERY_CENTER): Payer: 59 | Admitting: Internal Medicine

## 2016-07-19 VITALS — BP 125/84 | HR 62 | Temp 98.0°F | Resp 15 | Ht 67.0 in | Wt 166.0 lb

## 2016-07-19 DIAGNOSIS — D124 Benign neoplasm of descending colon: Secondary | ICD-10-CM

## 2016-07-19 DIAGNOSIS — Z1211 Encounter for screening for malignant neoplasm of colon: Secondary | ICD-10-CM

## 2016-07-19 DIAGNOSIS — Z86010 Personal history of colonic polyps: Secondary | ICD-10-CM

## 2016-07-19 DIAGNOSIS — K648 Other hemorrhoids: Secondary | ICD-10-CM

## 2016-07-19 DIAGNOSIS — D125 Benign neoplasm of sigmoid colon: Secondary | ICD-10-CM

## 2016-07-19 DIAGNOSIS — D120 Benign neoplasm of cecum: Secondary | ICD-10-CM

## 2016-07-19 DIAGNOSIS — D122 Benign neoplasm of ascending colon: Secondary | ICD-10-CM | POA: Diagnosis not present

## 2016-07-19 DIAGNOSIS — K5730 Diverticulosis of large intestine without perforation or abscess without bleeding: Secondary | ICD-10-CM

## 2016-07-19 HISTORY — DX: Diverticulosis of large intestine without perforation or abscess without bleeding: K57.30

## 2016-07-19 HISTORY — DX: Other hemorrhoids: K64.8

## 2016-07-19 HISTORY — PX: COLONOSCOPY W/ POLYPECTOMY: SHX1380

## 2016-07-19 LAB — HM COLONOSCOPY

## 2016-07-19 MED ORDER — SODIUM CHLORIDE 0.9 % IV SOLN: 500.0000 mL | INTRAVENOUS | Status: DC

## 2016-07-19 NOTE — Patient Instructions (Signed)
YOU HAD AN ENDOSCOPIC PROCEDURE TODAY AT [facility]:   Refer to the procedure report that was given to you for any specific questions about what was found during the examination.  If the procedure report does not answer your questions, please call your gastroenterologist to clarify.  If you requested that your care partner not be given the details of your procedure findings, then the procedure report has been included in a sealed envelope for you to review at your convenience later.  YOU SHOULD EXPECT: Some feelings of bloating in the abdomen. Passage of more gas than usual.  Walking can help get rid of the air that was put into your GI tract during the procedure and reduce the bloating. If you had a lower endoscopy (such as a colonoscopy or flexible sigmoidoscopy) you may notice spotting of blood in your stool or on the toilet paper. If you underwent a bowel prep for your procedure, you may not have a normal bowel movement for a few days.  Please Note:  You might notice some irritation and congestion in your nose or some drainage.  This is from the oxygen used during your procedure.  There is no need for concern and it should clear up in a day or so.  SYMPTOMS TO REPORT IMMEDIATELY:   Following lower endoscopy (colonoscopy or flexible sigmoidoscopy):  Excessive amounts of blood in the stool  Significant tenderness or worsening of abdominal pains  Swelling of the abdomen that is new, acute  Fever of 100F or higher  For urgent or emergent issues, a gastroenterologist can be reached at any hour by calling [phone].   DIET:  We do recommend a small meal at first, but then you may proceed to your regular diet.  Drink plenty of fluids but you should avoid alcoholic beverages for 24 hours.  ACTIVITY:  You should plan to take it easy for the rest of today and you should NOT DRIVE or use heavy machinery until tomorrow (because of the sedation medicines used during the test).     FOLLOW UP: Our staff will call the number listed on your records the next business day following your procedure to check on you and address any questions or concerns that you may have regarding the information given to you following your procedure. If we do not reach you, we will leave a message.  However, if you are feeling well and you are not experiencing any problems, there is no need to return our call.  We will assume that you have returned to your regular daily activities without incident.  If any biopsies were taken you will be contacted by phone or by letter within the next 1-3 weeks.  Please call us at [phone] if you have not heard about the biopsies in 3 weeks.    Await biopsy results Repeat Colonoscopy in 5 years Diverticulosis (handout given) Hemorrhoids (handout given)  SIGNATURES/CONFIDENTIALITY: You and/or your care partner have signed paperwork which will be entered into your electronic medical record.  These signatures attest to the fact that that the information above on your After Visit Summary has been reviewed and is understood.  Full responsibility of the confidentiality of this discharge information lies with you and/or your care-partner.

## 2016-07-19 NOTE — Progress Notes (Signed)
Report to PACU, RN, vss, BBS= Clear.  

## 2016-07-19 NOTE — Progress Notes (Signed)
Called to room to assist during endoscopic procedure.  Patient ID and intended procedure confirmed with present staff. Received instructions for my participation in the procedure from the performing physician.  

## 2016-07-19 NOTE — Op Note (Signed)
[facility] Patient Name: Adam Gaines Procedure Date: 07/19/2016 8:41 AM MRN: [id number] Endoscopist: Wilhemina Bonito. Marina Goodell , MD Age: 71 Referring MD:  Date of Birth: [birth year] Gender: Male Account #: [id number] Procedure:                Colonoscopy, with cold snare polypectomy X4 Indications:              High risk colon cancer surveillance: Personal                            history of multiple (3 or more) adenomas. Prior                            examinations 1997, 2002, 2008, and 2013 Medicines:                Monitored Anesthesia Care Procedure:                Pre-Anesthesia Assessment:                           - Prior to the procedure, a History and Physical                            was performed, and patient medications and                            allergies were reviewed. The patient's tolerance of                            previous anesthesia was also reviewed. The risks                            and benefits of the procedure and the sedation                            options and risks were discussed with the patient.                            All questions were answered, and informed consent                            was obtained. Prior Anticoagulants: The patient has                            taken no previous anticoagulant or antiplatelet                            agents. ASA Grade Assessment: II - A patient with                            mild systemic disease. After reviewing the risks                            and benefits, the patient was deemed in  satisfactory condition to undergo the procedure.                           After obtaining informed consent, the colonoscope                            was passed under direct vision. Throughout the                            procedure, the patient's blood pressure, pulse, and                            oxygen saturations were monitored continuously. The     Model CF-HQ190L [id number]) scope was introduced                            through the anus and advanced to the the cecum,                            identified by appendiceal orifice and ileocecal                            valve. The ileocecal valve, appendiceal orifice,                            and rectum were photographed. The quality of the                            bowel preparation was excellent. The colonoscopy                            was performed without difficulty. The patient                            tolerated the procedure well. The bowel preparation                            used was SUPREP. Scope In: 8:54:33 AM Scope Out: 9:11:06 AM Scope Withdrawal Time: 0 hours 12 minutes 49 seconds  Total Procedure Duration: 0 hours 16 minutes 33 seconds  Findings:                 Four polyps were found in the sigmoid colon,                            ascending colon and cecum. The polyps were 1 to 3                            mm in size. These polyps were removed with a cold                            snare. Resection and retrieval were complete.  Multiple diverticula were found in the sigmoid                            colon.                           Internal hemorrhoids were found during                            retroflexion. The hemorrhoids were small.                           The exam was otherwise without abnormality on                            direct and retroflexion views. Complications:            No immediate complications. Estimated blood loss:                            None. Estimated Blood Loss:     Estimated blood loss: none. Impression:               - Four 1 to 3 mm polyps in the sigmoid colon, in                            the ascending colon and in the cecum, removed with                            a cold snare. Resected and retrieved.                           - Diverticulosis in the sigmoid colon.                            - Internal hemorrhoids.                           - The examination was otherwise normal on direct                            and retroflexion views. Recommendation:           - Repeat colonoscopy in 5 years for surveillance.                           - Patient has a contact number available for                            emergencies. The signs and symptoms of potential                            delayed complications were discussed with the                            patient. Return to normal activities tomorrow.  Written discharge instructions were provided to the                            patient.                           - Resume previous diet.                           - Continue present medications.                           - Await pathology results. Wilhemina Bonito. Marina Goodell, MD 07/19/2016 9:15:17 AM This report has been signed electronically.

## 2016-07-22 ENCOUNTER — Telehealth: Payer: Self-pay

## 2016-07-22 NOTE — Telephone Encounter (Signed)
  Follow up Call-  Call back number 07/19/2016  Post procedure Call Back phone  # [phone]  Permission to leave phone message Yes  Some recent data might be hidden     Patient questions:  Do you have a fever, pain , or abdominal swelling? No. Pain Score  0 *  Have you tolerated food without any problems? Yes.    Have you been able to return to your normal activities? Yes.    Do you have any questions about your discharge instructions: Diet   No. Medications  No. Follow up visit  No.  Do you have questions or concerns about your Care? No.  Actions: * If pain score is 4 or above: No action needed, pain <4.

## 2016-07-23 ENCOUNTER — Encounter: Payer: Self-pay | Admitting: Internal Medicine

## 2016-09-16 DIAGNOSIS — Z23 Encounter for immunization: Secondary | ICD-10-CM | POA: Diagnosis not present

## 2016-09-17 DIAGNOSIS — R972 Elevated prostate specific antigen [PSA]: Secondary | ICD-10-CM | POA: Diagnosis not present

## 2016-09-17 LAB — PSA: PSA: 4.16

## 2016-09-19 NOTE — Progress Notes (Signed)
Subjective:   Adam Gaines was seen in consultation in the movement disorder clinic at the request of Scarlette Calico, MD.  The evaluation is for tremor.  He is accompanied by his wife who supplements the history.  The patient is a 71 y.o. right handed male with a history of tremor.  Pt states that "my penmenship now sucks."  He has trouble with tremor when picking up a heavy cup or with eating soup.  His daughter recently told him that she noted tremor and pushed for the referral.  He doesn't know if it is both hands that have the tremor as he is "very right handed."  Tremor started about a few years ago (documented in 11/2011 in records).  He doesn't think that it has changed since it has started.  There is no known family hx of tremor.    Affected by caffeine:  Unknown (drinks 2 cups coffee per day and no other sources of caffeine) Affected by alcohol:  No. Affected by stress:  No. Affected by fatigue:  No. Spills soup if on spoon:  Yes.   Spills glass of liquid if full:  Yes.    Affects ADL's (tying shoes, brushing teeth, etc):  No., except shaving (blade)  Other sx's:    No change in voice No swallowing trouble Sleeping well; no vivid dreams;  No trouble getting out of deep couch, chair, car except has stiffness Good balance No falls; had drop on L after back surgery - regained most of strength  Current/Previously tried tremor medications: n/a  Current medications that may exacerbate tremor:  none  Outside reports reviewed: historical medical records, lab reports and referral letter/letters.  09/24/16 update:  Patient seen today in follow-up.  I have not seen him in almost 2 years.  At that time, he had evidence of essential tremor and no evidence of a neurodegenerative tremor.  He did have evidence of peripheral neuropathy.  I have reviewed records since last visit.  Today, the patient states That he just wanted to be checked again, as the patient's wife thought his tremor has  gotten worse.  He thinks that his tremor has been stable.  It is more in the R hand but he is R hand dominant.  It is worse after he exercises, especially after body pump or pilates.  It will affect shaving.  He notes holding full cup of water is a challenge as is eating soup.  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  He notes some tremor with using the mouse.    No Known Allergies  Outpatient Encounter Prescriptions as of 09/24/2016  Medication Sig  . Ascorbic Acid (VITAMIN C PO) Take by mouth daily.  Marland Kitchen aspirin 81 MG tablet Take 81 mg by mouth daily.    Marland Kitchen atorvastatin (LIPITOR) 20 MG tablet Take 1 tablet (20 mg total) by mouth daily.  Marland Kitchen b complex vitamins tablet Take 1 tablet by mouth daily.  . Cholecalciferol (VITAMIN D) 2000 UNITS tablet Take 1 tablet (2,000 Units total) by mouth daily.  . TURMERIC PO Take 1 capsule by mouth daily.   Facility-Administered Encounter Medications as of 09/24/2016  Medication  . 0.9 %  sodium chloride infusion    Past Medical History:  Diagnosis Date  . Benign essential tremor 11/18/2011   Seen for this November 18, 2011   . Cardiovascular risk factor 03/08/2012  . Chickenpox   . Chronic eczema    of the plantar aspect right  foot  . Colon polyps    Tubular Adenoma 2008  . Diverticulosis   . Intestinal angiodysplasia   . Lumbago   . Other and unspecified hyperlipidemia   . Personal history of other disorder of urinary system   . TONSILLECTOMY AND ADENOIDECTOMY, HX OF 07/24/2007   Qualifier: Diagnosis of  By: [place], [place]      Past Surgical History:  Procedure Laterality Date  . left L5 hemilaminotomy with microdiskectomy    . repair of deviated septum    . TONSILLECTOMY      Social History   Social History  . Marital status: Married    Spouse name: N/A  . Number of children: 2  . Years of education: 65   Occupational History  . 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  Smoking status: Former Smoker    Quit date: 11/28/1978  . Smokeless tobacco: Never Used  . Alcohol use 3.0 oz/week    3 Glasses of wine, 2 Standard drinks or equivalent per week     Comment: social  . Drug use: No  . Sexual activity: Yes    Partners: Female   Other Topics Concern  . Not on file   Social History Narrative   HSG, [place], [place] JD '70. [employer] '70-'74. Marriage '67 and  in good health. 2 dtrs - '69, '72; 6 grandchildren. Work - Furniture conservator/restorer.                         Family Status  Relation Status  . Mother Deceased at age 104   MI  . Father Deceased at age 60   CAD/MI  . Daughter Alive   [phone]  . Daughter Alive   [phone]  . Brother Alive  . Brother Alive  . Maternal Grandmother Deceased  . Other   . Paternal Grandmother Deceased  . Maternal Grandfather Deceased  . Paternal Grandfather Deceased  . Neg Hx     Review of Systems Some SOB with exertion.  No lateralizing weakness/paresthesias.  A complete 10 system ROS was obtained and was negative apart from what is mentioned.   Objective:   VITALS:   Vitals:   09/24/16 1043  BP: 110/80  Pulse: 70  SpO2: 98%  Weight: 165 lb (74.8 kg)  Height: 5\' 7"  (1.702 m)   Gen:  Appears stated age and in NAD. HEENT:  Normocephalic, atraumatic. The mucous membranes are moist. The superficial temporal arteries are without ropiness or tenderness. Cardiovascular: Regular rate and rhythm. Lungs: Clear to auscultation bilaterally. Neck: There are no carotid bruits noted bilaterally.  NEUROLOGICAL:  Orientation:  The patient is alert and oriented x 3.  Recent and remote memory are intact.  Attention span and concentration are normal.  Able to name objects and repeat without trouble.  Fund of knowledge is appropriate Cranial nerves: There is good facial symmetry. The pupils are equal round and reactive to light bilaterally. Fundoscopic exam reveals clear disc margins  bilaterally. Extraocular muscles are intact and visual fields are full to confrontational testing. Speech is fluent and clear. Soft palate rises symmetrically and there is no tongue deviation. Hearing is intact to conversational tone. Tone: Tone is good throughout. Sensation: Sensation is intact to light touch and pinprick throughout (facial, trunk, extremities). Vibration is decreased at the bilateral big toe, ankle and knee. There is  no extinction with double simultaneous stimulation. There is no sensory dermatomal level identified. Coordination:  The patient has no dysdiadichokinesia or dysmetria.  No decremation. Motor: Strength is 5/5 in the bilateral upper and lower extremities.  Shoulder shrug is equal bilaterally.  There is no pronator drift.  There are no fasciculations noted. Gait and Station: The patient is able to ambulate without difficulty.  MOVEMENT EXAM: Tremor:  There is mild tremor in the UE, noted most significantly with intention.  The right is more noticeable than the left.  He has no significant difficulty with Archimedes spirals, but tremor is evident on the right.  It is slightly worse on the right when compared to 2 years ago.  While he does spills water when he pours it from one glass to another, he also pours it very carelessly and quickly (same as 2 years ago).  There is no rest tremor.  He has no tremor with distraction procedures.  LABS  Lab Results  Component Value Date   VITAMINB12 372 10/26/2014   Lab Results  Component Value Date   TSH 1.42 11/16/2015   Lab Results  Component Value Date   WBC 7.3 09/18/2015   HGB 14.0 09/18/2015   HCT 40.9 09/18/2015   MCV 91.2 09/18/2015   PLT 192.0 09/18/2015     Chemistry      Component Value Date/Time   NA 134 (L) 09/18/2015 1626   K 4.0 09/18/2015 1626   CL 100 09/18/2015 1626   CO2 29 09/18/2015 1626   BUN 15 09/18/2015 1626   CREATININE 0.88 09/18/2015 1626      Component Value Date/Time   CALCIUM 9.4  09/18/2015 1626   ALKPHOS 36 (L) 05/15/2015 0841   AST 34 05/15/2015 0841   ALT 28 05/15/2015 0841   BILITOT 0.6 05/15/2015 0841     Lab Results  Component Value Date   HGBA1C 5.3 11/09/2014        Assessment/Plan:   1.  Essential Tremor.  -Reassurance that I still see no evidence of a neurodegenerative disorder such as Parkinson's disease.  -We talked about limiting caffeine use.  We talked about pathophysiology and prognosis.  We talked about the effect that stress and exercise has on tremor.  Offered meds but patient declined.  He is an Forensic psychologist and still working but tremor not interfering with work.   2.  Possible peripheral neuropathy  -evidence of this on examination but clinically doesn't c/o sx's.   3.  B12 deficiency  -on supplement 4.  He would like to be seen yearly.

## 2016-09-23 DIAGNOSIS — N401 Enlarged prostate with lower urinary tract symptoms: Secondary | ICD-10-CM | POA: Diagnosis not present

## 2016-09-23 DIAGNOSIS — R972 Elevated prostate specific antigen [PSA]: Secondary | ICD-10-CM | POA: Diagnosis not present

## 2016-09-24 ENCOUNTER — Ambulatory Visit (INDEPENDENT_AMBULATORY_CARE_PROVIDER_SITE_OTHER): Payer: 59 | Admitting: Neurology

## 2016-09-24 ENCOUNTER — Encounter: Payer: Self-pay | Admitting: Neurology

## 2016-09-24 VITALS — BP 110/80 | HR 70 | Ht 67.0 in | Wt 165.0 lb

## 2016-09-24 DIAGNOSIS — G250 Essential tremor: Secondary | ICD-10-CM | POA: Diagnosis not present

## 2016-09-24 DIAGNOSIS — E538 Deficiency of other specified B group vitamins: Secondary | ICD-10-CM

## 2016-10-10 ENCOUNTER — Encounter: Payer: Self-pay | Admitting: Internal Medicine

## 2016-11-19 DIAGNOSIS — L821 Other seborrheic keratosis: Secondary | ICD-10-CM | POA: Diagnosis not present

## 2016-11-19 DIAGNOSIS — D1800 Hemangioma unspecified site: Secondary | ICD-10-CM | POA: Diagnosis not present

## 2016-11-19 DIAGNOSIS — D1801 Hemangioma of skin and subcutaneous tissue: Secondary | ICD-10-CM | POA: Diagnosis not present

## 2016-11-19 DIAGNOSIS — D225 Melanocytic nevi of trunk: Secondary | ICD-10-CM | POA: Diagnosis not present

## 2017-01-27 DIAGNOSIS — Z23 Encounter for immunization: Secondary | ICD-10-CM | POA: Diagnosis not present

## 2017-04-28 ENCOUNTER — Encounter: Payer: Self-pay | Admitting: Internal Medicine

## 2017-04-28 ENCOUNTER — Ambulatory Visit (INDEPENDENT_AMBULATORY_CARE_PROVIDER_SITE_OTHER): Payer: 59 | Admitting: Internal Medicine

## 2017-04-28 VITALS — BP 130/80 | HR 70 | Temp 98.1°F | Resp 16 | Ht 67.0 in | Wt 165.2 lb

## 2017-04-28 DIAGNOSIS — Z0000 Encounter for general adult medical examination without abnormal findings: Secondary | ICD-10-CM

## 2017-04-28 DIAGNOSIS — E785 Hyperlipidemia, unspecified: Secondary | ICD-10-CM | POA: Diagnosis not present

## 2017-04-28 MED ORDER — ZOSTER VAC RECOMB ADJUVANTED 50 MCG/0.5ML IM SUSR: 0.5000 mL | Freq: Once | INTRAMUSCULAR | 1 refills | Status: AC

## 2017-04-28 NOTE — Progress Notes (Signed)
Subjective:  Patient ID: Adam Gaines, male    DOB: 1946-02-10  Age: 71 y.o. MRN: [id number]  CC: Annual Exam and Hyperlipidemia   HPI Adam Gaines presents for a CPX.    He feels well and offers no complaints.  He is very active and has had no episodes of CP, DOE, palpitations, edema, or fatigue.  He is taking the statin and has had no muscle or joint aches.  Past Medical History:  Diagnosis Date  . Benign essential tremor 11/18/2011   Seen for this November 18, 2011   . Cardiovascular risk factor 03/08/2012  . Chickenpox   . Chronic eczema    of the plantar aspect right foot  . Colon polyps    Tubular Adenoma 2008  . Diverticulosis   . Intestinal angiodysplasia   . Lumbago   . Other and unspecified hyperlipidemia   . Personal history of other disorder of urinary system   . TONSILLECTOMY AND ADENOIDECTOMY, HX OF 07/24/2007   Qualifier: Diagnosis of  By: Genelle Gather CMA, [place]     Past Surgical History:  Procedure Laterality Date  . left L5 hemilaminotomy with microdiskectomy    . repair of deviated septum    . TONSILLECTOMY      reports that he quit smoking about 38 years ago. he has never used smokeless tobacco. He reports that he drinks about 3.0 oz of alcohol per week. He reports that he does not use drugs. family history includes COPD in his mother; Coronary artery disease in his father and mother; Diabetes in his maternal grandmother and paternal grandmother; Heart attack (age of onset: 65) in his father; Heart attack (age of onset: 45) in his mother; Heart disease in his father and mother; Hyperlipidemia in his father; Hypertension in his father; Other in his other. No Known Allergies  Outpatient Medications Prior to Visit  Medication Sig Dispense Refill  . Ascorbic Acid (VITAMIN C PO) Take by mouth daily.    Marland Kitchen aspirin 81 MG tablet Take 81 mg by mouth daily.      Marland Kitchen atorvastatin (LIPITOR) 20 MG tablet Take 1 tablet (20 mg total) by mouth daily. 90 tablet 3  . b  complex vitamins tablet Take 1 tablet by mouth daily.    . Cholecalciferol (VITAMIN D) 2000 UNITS tablet Take 1 tablet (2,000 Units total) by mouth daily. 30 tablet 11  . TURMERIC PO Take 1 capsule by mouth daily.    Marland Kitchen 0.9 %  sodium chloride infusion      No facility-administered medications prior to visit.     ROS Review of Systems  Constitutional: Negative.  Negative for appetite change, diaphoresis and fatigue.  HENT: Negative.   Eyes: Negative.  Negative for visual disturbance.  Respiratory: Negative.  Negative for cough, chest tightness, shortness of breath and wheezing.   Cardiovascular: Negative.  Negative for chest pain, palpitations and leg swelling.  Gastrointestinal: Negative.  Negative for abdominal pain, constipation, diarrhea, nausea and vomiting.  Endocrine: Negative.   Genitourinary: Negative.  Negative for difficulty urinating.  Musculoskeletal: Negative.  Negative for arthralgias, back pain, myalgias and neck pain.  Skin: Negative.  Negative for rash.  Neurological: Negative.  Negative for dizziness, weakness and headaches.  Hematological: Negative for adenopathy. Does not bruise/bleed easily.  Psychiatric/Behavioral: Negative.     Objective:  BP 130/80 (BP Location: Left Arm, Patient Position: Sitting, Cuff Size: Normal)   Pulse 70   Temp 98.1 F (36.7 C) (Oral)   Resp 16  Ht 5\' 7"  (1.702 m)   Wt 165 lb 4 oz (75 kg)   SpO2 99%   BMI 25.88 kg/m   BP Readings from Last 3 Encounters:  04/28/17 130/80  09/24/16 110/80  07/19/16 125/84    Wt Readings from Last 3 Encounters:  04/28/17 165 lb 4 oz (75 kg)  09/24/16 165 lb (74.8 kg)  07/19/16 166 lb (75.3 kg)    Physical Exam  Constitutional: He is oriented to person, place, and time. No distress.  HENT:  Mouth/Throat: Oropharynx is clear and moist. No oropharyngeal exudate.  Eyes: Conjunctivae are normal. Right eye exhibits no discharge. Left eye exhibits no discharge. No scleral icterus.  Neck:  Normal range of motion. Neck supple. No JVD present. No thyromegaly present.  Cardiovascular: Normal rate and regular rhythm. Exam reveals no gallop.  No murmur heard. Pulmonary/Chest: Effort normal and breath sounds normal. No respiratory distress. He has no wheezes. He has no rales. He exhibits no tenderness.  Abdominal: Soft. Bowel sounds are normal. He exhibits no distension and no mass. There is no tenderness. There is no rebound and no guarding.  Genitourinary:  Genitourinary Comments: GU and rectal exams were deferred at his request as he tells me that he saw his urologist earlier this year and that the exam has already been completed and was normal.  Musculoskeletal: Normal range of motion. He exhibits no edema, tenderness or deformity.  Neurological: He is alert and oriented to person, place, and time.  Skin: Skin is warm and dry. No rash noted. He is not diaphoretic. No erythema. No pallor.  Psychiatric: He has a normal mood and affect. His behavior is normal. Judgment and thought content normal.  Vitals reviewed.   Lab Results  Component Value Date   WBC 6.6 04/30/2017   HGB 15.4 04/30/2017   HCT 45.8 04/30/2017   PLT 183.0 04/30/2017   GLUCOSE 113 (H) 04/30/2017   CHOL 194 04/30/2017   TRIG 76.0 04/30/2017   HDL 91.20 04/30/2017   LDLCALC 87 04/30/2017   ALT 29 04/30/2017   AST 34 04/30/2017   NA 140 04/30/2017   K 4.4 04/30/2017   CL 105 04/30/2017   CREATININE 0.93 04/30/2017   BUN 16 04/30/2017   CO2 29 04/30/2017   TSH 1.74 04/30/2017   PSA 4.16 09/17/2016   HGBA1C 5.3 11/09/2014    Ct Cardiac Scoring  Addendum Date: 03/12/2012   **ADDENDUM** CREATED: 03/12/2012 18:04:37 CARDIAC CTA WITH CALCIUM SCORE 03/11/2012 15:18:00 Ordering Physician: Berta Minor Reading Physician: DaltonS.Mclean Protocol:  The patient scanned on a Siemens 16 slice scanner. After an initial AP and lateral topogram, 3 mm axial slices were performed through the heart for calcium scoring.  Indications: CAD risk factors DETAILED FINDINGS: Quality of Study: Good Coronary Calcium Score: 0 Noncardiac findings to be read by [facility]. IMPRESSION: Coronary artery calcium score of 0, placing the patient in a low risk category for future cardiovascular events. **END ADDENDUM** SIGNED BY: Laurey Morale  Result Date: 03/11/2012 OVER-READ INTERPRETATION - CT CHEST The following report is an over-read performed by radiologist Dr. Karn Cassis. Reche Dixon, M.D. of [facility], [place] on 03/11/2012 16:42:08.  This over-read does not include interpretation of cardiac or coronary anatomy or pathology.  The coronary calcium score interpretation by the cardiologist is attached. Findings: Lung windows demonstrate minimal scarring at the left lung base. Soft tissue windows demonstrate ascending aortic size upper normal, 3.8 cm on image 1 of series 5.  Incompletely imaged. No pericardial or  pleural effusion.  No imaged thoracic adenopathy within the included chest.  No pericardial or pleural effusion. Limited abdominal imaging demonstrates right liver lobe lesion which measures 1.0 cm and is relatively well circumscribed and fluid density on image 37.  A sub centimeter low density hepatic dome lesion on image 34 which is also favored to represent a small cyst.  Prominent osteophytes within the thoracic spine. IMPRESSION 1.  No acute extracardiac findings within the chest. 2.  Incompletely imaged borderline dilated ascending aorta. 3.  2 low density liver lesions. Most likely cysts.  If there is any history of primary malignancy, recommend correlation with prior cross-sectional imaging and consideration of pre and post contrast abdominal MRI. Coronary Calcium Score: Indication: Risk stratification The patient was scanned on a Siemens Sensation 16 slice scanner. Noncontrast axial slices were done through the heart.  Calicum scoring was done on a dedicated work station using the [employer]  Findings: Coronary  calcium score 0 Upper limits of normal ascending aortic root 3.8 cm See separate report from Dr Jeronimo Greaves regarding noncardiac findings and possible liver cysts. Charlton Haws MD [facility] Original Report Authenticated By: Gretta Began    Assessment & Plan:   Lossie was seen today for annual exam and hyperlipidemia.  Diagnoses and all orders for this visit:  Hyperlipidemia with target LDL less than 100- He has achieved his LDL goal and is doing well on the statin.  Routine general medical examination at a health care facility -     Lipid panel; Future -     Comprehensive metabolic panel; Future -     CBC with Differential/Platelet; Future -     TSH; Future -     Zoster Vaccine Adjuvanted [facility]) injection; Inject 0.5 mLs into the muscle once for 1 dose.   I am having Audie Box start on Zoster Vaccine Adjuvanted. I am also having him maintain his aspirin, Vitamin D, b complex vitamins, TURMERIC PO, atorvastatin, and Ascorbic Acid (VITAMIN C PO). We will stop administering sodium chloride.  Meds ordered this encounter  Medications  . Zoster Vaccine Adjuvanted [facility]) injection    Sig: Inject 0.5 mLs into the muscle once for 1 dose.    Dispense:  0.5 mL    Refill:  1   See AVS for instructions about healthy living and anticipatory guidance.   Follow-up: Return if symptoms worsen or fail to improve.  Sanda Linger, MD

## 2017-04-28 NOTE — Patient Instructions (Signed)

## 2017-04-30 ENCOUNTER — Encounter: Payer: Self-pay | Admitting: Internal Medicine

## 2017-04-30 ENCOUNTER — Other Ambulatory Visit (INDEPENDENT_AMBULATORY_CARE_PROVIDER_SITE_OTHER): Payer: 59

## 2017-04-30 DIAGNOSIS — Z0000 Encounter for general adult medical examination without abnormal findings: Secondary | ICD-10-CM

## 2017-04-30 LAB — COMPREHENSIVE METABOLIC PANEL
ALBUMIN: 4.4 g/dL (ref 3.5–5.2)
ALT: 29 U/L (ref 0–53)
AST: 34 U/L (ref 0–37)
Alkaline Phosphatase: 41 U/L (ref 39–117)
BUN: 16 mg/dL (ref 6–23)
CALCIUM: 9.7 mg/dL (ref 8.4–10.5)
CHLORIDE: 105 meq/L (ref 96–112)
CO2: 29 meq/L (ref 19–32)
Creatinine, Ser: 0.93 mg/dL (ref 0.40–1.50)
GFR: 84.52 mL/min (ref >60.00)
Glucose, Bld: 113 mg/dL — ABNORMAL HIGH (ref 70–99)
POTASSIUM: 4.4 meq/L (ref 3.5–5.1)
SODIUM: 140 meq/L (ref 135–145)
Total Bilirubin: 0.6 mg/dL (ref 0.2–1.2)
Total Protein: 6.8 g/dL (ref 6.0–8.3)

## 2017-04-30 LAB — LIPID PANEL
CHOL/HDL RATIO: 2
Cholesterol: 194 mg/dL (ref 0–200)
HDL: 91.2 mg/dL (ref >39.00)
LDL CALC: 87 mg/dL (ref 0–99)
NONHDL: 102.49
TRIGLYCERIDES: 76 mg/dL (ref 0.0–149.0)
VLDL: 15.2 mg/dL (ref 0.0–40.0)

## 2017-04-30 LAB — CBC WITH DIFFERENTIAL/PLATELET
BASOS ABS: 0 10*3/uL (ref 0.0–0.1)
BASOS PCT: 0.5 % (ref 0.0–3.0)
EOS ABS: 0.1 10*3/uL (ref 0.0–0.7)
Eosinophils Relative: 1.6 % (ref 0.0–5.0)
HEMATOCRIT: 45.8 % (ref 39.0–52.0)
HEMOGLOBIN: 15.4 g/dL (ref 13.0–17.0)
LYMPHS PCT: 25 % (ref 12.0–46.0)
Lymphs Abs: 1.6 10*3/uL (ref 0.7–4.0)
MCHC: 33.7 g/dL (ref 30.0–36.0)
MCV: 95.3 fl (ref 78.0–100.0)
Monocytes Absolute: 0.6 10*3/uL (ref 0.1–1.0)
Monocytes Relative: 9.9 % (ref 3.0–12.0)
NEUTROS ABS: 4.1 10*3/uL (ref 1.4–7.7)
Neutrophils Relative %: 63 % (ref 43.0–77.0)
PLATELETS: 183 10*3/uL (ref 150.0–400.0)
RBC: 4.8 Mil/uL (ref 4.22–5.81)
RDW: 12.9 % (ref 11.5–15.5)
WBC: 6.6 10*3/uL (ref 4.0–10.5)

## 2017-04-30 LAB — TSH: TSH: 1.74 u[IU]/mL (ref 0.35–4.50)

## 2017-04-30 NOTE — Assessment & Plan Note (Signed)

## 2017-06-12 ENCOUNTER — Ambulatory Visit: Payer: 59 | Admitting: Internal Medicine

## 2017-06-12 ENCOUNTER — Encounter: Payer: Self-pay | Admitting: Internal Medicine

## 2017-06-12 VITALS — BP 130/68 | HR 61 | Ht 67.0 in | Wt 164.8 lb

## 2017-06-12 DIAGNOSIS — E785 Hyperlipidemia, unspecified: Secondary | ICD-10-CM | POA: Diagnosis not present

## 2017-06-12 DIAGNOSIS — I5189 Other ill-defined heart diseases: Secondary | ICD-10-CM

## 2017-06-12 DIAGNOSIS — I351 Nonrheumatic aortic (valve) insufficiency: Secondary | ICD-10-CM

## 2017-06-12 DIAGNOSIS — I519 Heart disease, unspecified: Secondary | ICD-10-CM | POA: Diagnosis not present

## 2017-06-12 MED ORDER — ATORVASTATIN CALCIUM 20 MG PO TABS: 20.0000 mg | ORAL_TABLET | Freq: Every day | ORAL | 3 refills | Status: DC

## 2017-06-12 NOTE — Progress Notes (Signed)
Follow-up Outpatient Visit Date: 06/12/2017  Primary Care Provider: Janith Lima, MD [address] [address]  Chief Complaint: Follow-up dyspnea on exertion  HPI:  Mr. Ruelas is a 72 y.o. year-old male with history of hyperlipidemia and family history of coronary artery disease, who presents for follow-up of hyperlipidemia and exertional dyspnea. I last saw him in 06/2016, at which time he noted stable exertional dyspnea when exercising. Echocardiogram showed preserved LVEF with grade 2 diastolic dysfunction and mild aortic regurgitation. Today, Mr. Garabedian reports feeling the same. His shortness of breath has not gotten any worse and is not limiting him at all. He denies chest pain, palpitations, lightheadedness, and orthopnea. He notes minimal dependent edema. He is tolerating his medications well. Mr. Vanwingerden continues to exercise at the gym on a regular basis.  --------------------------------------------------------------------------------------------------  Cardiovascular History & Procedures: Cardiovascular Problems:  Hyperlipidemia and family history of coronary artery disease  Risk Factors:  Hyperlipidemia, male gender, age greater then 58, and family history of coronary artery disease  Cath/PCI:  None  CV Surgery:  None  EP Procedures and Devices:  None  Non-Invasive Evaluation(s):  Echocardiogram (06/28/16): Normal LV size with mild LVH. LVEF 50-55% with normal wall motion. Grade 2 diastolic dysfunction. Mild aortic regurgitation. Normal RV size and function.  Coronary Calcium Score (03/11/12): CCS = 0.  Borderline dilated ascending aorta.  Two low-density lesions in the liver; ? Cysts.  Exercise tolerance test (04/2008): Normal per Dr. Claris Gladden note.  Recent CV Pertinent Labs: Lab Results  Component Value Date   CHOL 194 04/30/2017   CHOL 203 (H) 05/09/2014   HDL 91.20 04/30/2017   HDL 85 05/09/2014   LDLCALC 87 04/30/2017     LDLCALC 97 05/09/2014   TRIG 76.0 04/30/2017   TRIG 106 05/09/2014   CHOLHDL 2 04/30/2017   K 4.4 04/30/2017   BUN 16 04/30/2017   CREATININE 0.93 04/30/2017    Past medical and surgical history were reviewed and updated in EPIC.  Current Meds  Medication Sig  . Ascorbic Acid (VITAMIN C PO) Take by mouth daily.  Marland Kitchen aspirin 81 MG tablet Take 81 mg by mouth daily.    Marland Kitchen atorvastatin (LIPITOR) 20 MG tablet Take 1 tablet (20 mg total) by mouth daily.  Marland Kitchen b complex vitamins tablet Take 1 tablet by mouth daily.  . Cholecalciferol (VITAMIN D) 2000 UNITS tablet Take 1 tablet (2,000 Units total) by mouth daily.  . TURMERIC PO Take 1 capsule by mouth daily.    Allergies: Patient has no known allergies.  Social History   Socioeconomic History  . Marital status: Married    Spouse name: Not on file  . Number of children: 2  . Years of education: 26  . Highest education level: Not on file  Social Needs  . Financial resource strain: Not on file  . Food insecurity - worry: Not on file  . Food insecurity - inability: Not on file  . Transportation needs - medical: Not on file  . Transportation needs - non-medical: Not on file  Occupational History  . Occupation: Engineer, petroleum: Warden/ranger    Comment: attorney, business law  Tobacco Use  . Smoking status: Former Smoker    Last attempt to quit: 11/28/1978    Years since quitting: 38.5  . Smokeless tobacco: Never Used  Substance and Sexual Activity  . Alcohol use: Yes    Alcohol/week: 3.0 oz    Types: 3  Glasses of wine, 2 Standard drinks or equivalent per week    Comment: social  . Drug use: No  . Sexual activity: Yes    Partners: Female  Other Topics Concern  . Not on file  Social History Narrative   HSG, [place], [place] JD '70. [facility] '70-'74. Marriage '67 and  in good health. 2 dtrs - '69, '72; 6 grandchildren. Work - Furniture conservator/restorer.                      Family  History  Problem Relation Age of Onset  . Heart attack Mother 59       after aspiration  . Coronary artery disease Mother   . Heart disease Mother   . COPD Mother        smoker  . Coronary artery disease Father   . Heart disease Father   . Hyperlipidemia Father   . Hypertension Father   . Heart attack Father 2  . Diabetes Maternal Grandmother   . Other Other        cardiovascular disease  . Diabetes Paternal Grandmother   . Colon cancer Neg Hx     Review of Systems: A 12-system review of systems was performed and was negative except as noted in the HPI.  --------------------------------------------------------------------------------------------------  Physical Exam: BP 130/68   Pulse 61   Ht 5\' 7"  (1.702 m)   Wt 164 lb 12.8 oz (74.8 kg)   SpO2 96%   BMI 25.81 kg/m   General:  Well-developed, well-nourished man, seated comfortably in the exam room. HEENT: No conjunctival pallor or scleral icterus. Moist mucous membranes.  OP clear. Neck: Supple without lymphadenopathy, thyromegaly, JVD, or HJR. Lungs: Normal work of breathing. Clear to auscultation bilaterally without wheezes or crackles. Heart: Regular rate and rhythm without murmurs, rubs, or gallops. Non-displaced PMI. Abd: Bowel sounds present. Soft, NT/ND without hepatosplenomegaly Ext: No lower extremity edema. Radial, PT, and DP pulses are 2+ bilaterally. Skin: Warm and dry without rash.  EKG:  NSR (HR 60 bpm). No abnormalities.  Lab Results  Component Value Date   WBC 6.6 04/30/2017   HGB 15.4 04/30/2017   HCT 45.8 04/30/2017   MCV 95.3 04/30/2017   PLT 183.0 04/30/2017    Lab Results  Component Value Date   NA 140 04/30/2017   K 4.4 04/30/2017   CL 105 04/30/2017   CO2 29 04/30/2017   BUN 16 04/30/2017   CREATININE 0.93 04/30/2017   GLUCOSE 113 (H) 04/30/2017   ALT 29 04/30/2017    Lab Results  Component Value Date   CHOL 194 04/30/2017   HDL 91.20 04/30/2017   LDLCALC 87 04/30/2017    TRIG 76.0 04/30/2017   CHOLHDL 2 04/30/2017    --------------------------------------------------------------------------------------------------  ASSESSMENT AND PLAN: Diastolic dysfunction Stable mild dyspnea on exertion with NYHA class I-II symptoms. Mr. Hanrahan appears euvolemic on exam. Blood pressure is upper normal today. No further testing or intervention at this time. We will consider functional study (ETT) next year or if symptoms worsen in the meantime.  Aortic regurgitation Mild with stable chronic dyspnea on exertion. BP upper normal today but typically better at home. Continue clinical follow-up.  Hyperlipidemia Most recent LDL was 87 in 04/2017. Without history of ASCVD, no indication for further escalation of therapy. We will continue with atorvastatin 20 mg daily.  Follow-up: Return to clinic in 1 year.  Nelva Bush, MD 06/12/2017 9:14 AM

## 2017-06-12 NOTE — Patient Instructions (Signed)
Medication Instructions:  Your physician recommends that you continue on your current medications as directed. Please refer to the Current Medication list given to you today.   Labwork: None   Testing/Procedures: None   Follow-Up: Your physician wants you to follow-up in: 1 year with Dr End. (January 2020). You will receive a reminder letter in the mail two months in advance. If you don't receive a letter, please call our office to schedule the follow-up appointment.        If you need a refill on your cardiac medications before your next appointment, please call your pharmacy.   

## 2017-06-13 ENCOUNTER — Ambulatory Visit: Payer: 59 | Admitting: Internal Medicine

## 2017-06-16 DIAGNOSIS — H2513 Age-related nuclear cataract, bilateral: Secondary | ICD-10-CM | POA: Diagnosis not present

## 2017-06-16 DIAGNOSIS — H5213 Myopia, bilateral: Secondary | ICD-10-CM | POA: Diagnosis not present

## 2017-06-16 DIAGNOSIS — H25013 Cortical age-related cataract, bilateral: Secondary | ICD-10-CM | POA: Diagnosis not present

## 2017-06-18 DIAGNOSIS — H25013 Cortical age-related cataract, bilateral: Secondary | ICD-10-CM | POA: Diagnosis not present

## 2017-06-18 DIAGNOSIS — H2513 Age-related nuclear cataract, bilateral: Secondary | ICD-10-CM | POA: Diagnosis not present

## 2017-06-25 DIAGNOSIS — H2511 Age-related nuclear cataract, right eye: Secondary | ICD-10-CM | POA: Diagnosis not present

## 2017-06-25 DIAGNOSIS — H25011 Cortical age-related cataract, right eye: Secondary | ICD-10-CM | POA: Diagnosis not present

## 2017-06-25 DIAGNOSIS — H25012 Cortical age-related cataract, left eye: Secondary | ICD-10-CM | POA: Diagnosis not present

## 2017-06-25 DIAGNOSIS — H2512 Age-related nuclear cataract, left eye: Secondary | ICD-10-CM | POA: Diagnosis not present

## 2017-06-30 ENCOUNTER — Ambulatory Visit: Payer: 59 | Admitting: Internal Medicine

## 2017-07-09 DIAGNOSIS — H2512 Age-related nuclear cataract, left eye: Secondary | ICD-10-CM | POA: Diagnosis not present

## 2017-07-09 DIAGNOSIS — H2511 Age-related nuclear cataract, right eye: Secondary | ICD-10-CM | POA: Diagnosis not present

## 2017-08-19 ENCOUNTER — Encounter: Payer: Self-pay | Admitting: Internal Medicine

## 2017-09-22 DIAGNOSIS — R972 Elevated prostate specific antigen [PSA]: Secondary | ICD-10-CM | POA: Diagnosis not present

## 2017-09-22 LAB — PSA: PSA: 6.29

## 2017-09-23 ENCOUNTER — Ambulatory Visit: Payer: 59 | Admitting: Neurology

## 2017-09-26 DIAGNOSIS — R972 Elevated prostate specific antigen [PSA]: Secondary | ICD-10-CM | POA: Diagnosis not present

## 2017-10-13 NOTE — Progress Notes (Signed)
Subjective:   Adam Gaines was seen in consultation in the movement disorder clinic at the request of Janith Lima, MD.  The evaluation is for tremor.  He is accompanied by his wife who supplements the history.  The patient is a 72 y.o. right handed male with a history of tremor.  Pt states that "my penmenship now sucks."  He has trouble with tremor when picking up a heavy cup or with eating soup.  His daughter recently told him that she noted tremor and pushed for the referral.  He doesn't know if it is both hands that have the tremor as he is "very right handed."  Tremor started about a few years ago (documented in 11/2011 in records).  He doesn't think that it has changed since it has started.  There is no known family hx of tremor.    Affected by caffeine:  Unknown (drinks 2 cups coffee per day and no other sources of caffeine) Affected by alcohol:  No. Affected by stress:  No. Affected by fatigue:  No. Spills soup if on spoon:  Yes.   Spills glass of liquid if full:  Yes.    Affects ADL's (tying shoes, brushing teeth, etc):  No., except shaving (blade)  Other sx's:    No change in voice No swallowing trouble Sleeping well; no vivid dreams;  No trouble getting out of deep couch, chair, car except has stiffness Good balance No falls; had drop on L after back surgery - regained most of strength  Current/Previously tried tremor medications: n/a  Current medications that may exacerbate tremor:  none  Outside reports reviewed: historical medical records, lab reports and referral letter/letters.  09/24/16 update:  Patient seen today in follow-up.  I have not seen him in almost 2 years.  At that time, he had evidence of essential tremor and no evidence of a neurodegenerative tremor.  He did have evidence of peripheral neuropathy.  I have reviewed records since last visit.  Today, the patient states That he just wanted to be checked again, as the patient's wife thought his tremor  has gotten worse.  He thinks that his tremor has been stable.  It is more in the R hand but he is R hand dominant.  It is worse after he exercises, especially after body pump or pilates.  It will affect shaving.  He notes holding full cup of water is a challenge as is eating soup.  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  He notes some tremor with using the mouse.    10/16/17 update: The patient is seen today in follow-up.  I last saw him about a year ago.  He reports that tremor has been about the same.  At times, he will think that tremor is worse in the right hand but other times not.  He doesn't want any medication.  He is still working.  "It doesn't affect my golf game."  It is worse after exercising. It is not interfering with activities of daily living unless he shaves after exercising.    The records that were made available to me were reviewed.  Has been to see cardiology in Jan.  Saw urology and had slightly elevated PSA and is to have that repeated.  No Known Allergies  Outpatient Encounter Medications as of 10/16/2017  Medication Sig  . Ascorbic Acid (VITAMIN C PO) Take by mouth daily.  Marland Kitchen atorvastatin (LIPITOR) 20 MG tablet Take 1 tablet (20 mg  total) by mouth daily.  Marland Kitchen b complex vitamins tablet Take 1 tablet by mouth daily.  . Cholecalciferol (VITAMIN D) 2000 UNITS tablet Take 1 tablet (2,000 Units total) by mouth daily.  . TURMERIC PO Take 1 capsule by mouth daily.  . [DISCONTINUED] aspirin 81 MG tablet Take 81 mg by mouth daily.     No facility-administered encounter medications on file as of 10/16/2017.     Past Medical History:  Diagnosis Date  . Benign essential tremor 11/18/2011   Seen for this November 18, 2011   . Cardiovascular risk factor 03/08/2012  . Chickenpox   . Chronic eczema    of the plantar aspect right foot  . Colon polyps    Tubular Adenoma 2008  . Diverticulosis   . Intestinal angiodysplasia   . Lumbago   . Other and unspecified  hyperlipidemia   . Personal history of other disorder of urinary system   . TONSILLECTOMY AND ADENOIDECTOMY, HX OF 07/24/2007   Qualifier: Diagnosis of  By: [place], [place]      Past Surgical History:  Procedure Laterality Date  . left L5 hemilaminotomy with microdiskectomy    . repair of deviated septum    . TONSILLECTOMY      Social History   Socioeconomic History  . Marital status: Married    Spouse name: Not on file  . Number of children: 2  . Years of education: 59  . Highest education level: Not on file  Occupational History  . Occupation: Engineer, petroleum: Warden/ranger    Comment: attorney, business law  Social Needs  . Financial resource strain: Not on file  . Food insecurity:    Worry: Not on file    Inability: Not on file  . Transportation needs:    Medical: Not on file    Non-medical: Not on file  Tobacco Use  . Smoking status: Former Smoker    Last attempt to quit: 11/28/1978    Years since quitting: 38.9  . Smokeless tobacco: Never Used  Substance and Sexual Activity  . Alcohol use: Yes    Alcohol/week: 3.0 oz    Types: 3 Glasses of wine, 2 Standard drinks or equivalent per week    Comment: social  . Drug use: No  . Sexual activity: Yes    Partners: Female  Lifestyle  . Physical activity:    Days per week: Not on file    Minutes per session: Not on file  . Stress: Not on file  Relationships  . Social connections:    Talks on phone: Not on file    Gets together: Not on file    Attends religious service: Not on file    Active member of club or organization: Not on file    Attends meetings of clubs or organizations: Not on file    Relationship status: Not on file  . Intimate partner violence:    Fear of current or ex partner: Not on file    Emotionally abused: Not on file    Physically abused: Not on file    Forced sexual activity: Not on file  Other Topics Concern  . Not on file  Social History Narrative   HSG, [place], [place] JD '70. [facility] '70-'74. Marriage '67 and  in good health. 2 dtrs - '69, '72; 6 grandchildren. Work - Furniture conservator/restorer.  Family Status  Relation Name Status  . Mother  Deceased at age 45       MI  . Father  Deceased at age 53       CAD/MI  . Daughter  Alive       [phone]  . Daughter  Alive       [phone]  . Brother  Alive  . Brother  Alive  . MGM  Deceased  . Other family hx of (Not Specified)  . PGM  Deceased  . MGF  Deceased  . PGF  Deceased  . Neg Hx  (Not Specified)    Review of Systems 10 system review is negative   Objective:   VITALS:   Vitals:   10/16/17 0757  BP: 132/80  Pulse: 70  SpO2: 97%  Weight: 158 lb (71.7 kg)  Height: 5\' 7"  (1.702 m)   Gen:  Appears stated age and in NAD. HEENT:  Normocephalic, atraumatic. The mucous membranes are moist. The superficial temporal arteries are without ropiness or tenderness. Cardiovascular: Regular rate and rhythm. Lungs: Clear to auscultation bilaterally. Neck: There are no carotid bruits noted bilaterally.  NEUROLOGICAL:  Orientation:  The patient is alert x oriented x 3 Cranial nerves: There is good facial symmetry.  Speech is fluent and clear. Soft palate rises symmetrically and there is no tongue deviation. Hearing is intact to conversational tone. Tone: Tone is good throughout. Sensation: Sensation is intact to light touch and pinprick throughout (facial, trunk, extremities). Vibration is mildly decreased at the bilateral big toe, ankle and knee. There is no extinction with double simultaneous stimulation. There is no sensory dermatomal level identified. Coordination:  The patient has no dysdiadichokinesia or dysmetria.  No decremation. Motor: Strength is 5/5 in the bilateral upper and lower extremities.  Shoulder shrug is equal bilaterally.  There is no pronator drift.  There are no fasciculations noted. Gait and Station: The patient is able to ambulate  without difficulty.  Walks in tandem fashion without trouble.  Walks on heels/toes.  MOVEMENT EXAM: Tremor:  There is intention tremor on the right.  Tremor is evident with archimedes spirals on the right only.    LABS  Lab Results  Component Value Date   VITAMINB12 372 10/26/2014   Lab Results  Component Value Date   TSH 1.74 04/30/2017   Lab Results  Component Value Date   WBC 6.6 04/30/2017   HGB 15.4 04/30/2017   HCT 45.8 04/30/2017   MCV 95.3 04/30/2017   PLT 183.0 04/30/2017     Chemistry      Component Value Date/Time   NA 140 04/30/2017 0724   K 4.4 04/30/2017 0724   CL 105 04/30/2017 0724   CO2 29 04/30/2017 0724   BUN 16 04/30/2017 0724   CREATININE 0.93 04/30/2017 0724      Component Value Date/Time   CALCIUM 9.7 04/30/2017 0724   ALKPHOS 41 04/30/2017 0724   AST 34 04/30/2017 0724   ALT 29 04/30/2017 0724   BILITOT 0.6 04/30/2017 0724     Lab Results  Component Value Date   HGBA1C 5.3 11/09/2014        Assessment/Plan:   1.  Essential Tremor.  -Reassurance that this is not PD and see no evidence of PD.    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   2.  Possible peripheral neuropathy  -evidence of this on examination but clinically doesn't c/o sx's.  3.  B12 deficiency  -on supplement now.  Will recheck level.   4.  Pt would like to continue to follow here on a yearly basis.  Will see him in one year.

## 2017-10-16 ENCOUNTER — Other Ambulatory Visit: Payer: 59

## 2017-10-16 ENCOUNTER — Ambulatory Visit: Payer: 59 | Admitting: Neurology

## 2017-10-16 ENCOUNTER — Encounter: Payer: Self-pay | Admitting: Neurology

## 2017-10-16 VITALS — BP 132/80 | HR 70 | Ht 67.0 in | Wt 158.0 lb

## 2017-10-16 DIAGNOSIS — E538 Deficiency of other specified B group vitamins: Secondary | ICD-10-CM | POA: Diagnosis not present

## 2017-10-16 DIAGNOSIS — G250 Essential tremor: Secondary | ICD-10-CM | POA: Diagnosis not present

## 2017-10-16 NOTE — Patient Instructions (Signed)
1. Your provider has requested that you have labwork completed today. Please go to [facility] ([id number]) on the second floor of this building before leaving the office today. You do not need to check in. If you are not called within 15 minutes please check with the front desk.   

## 2017-10-17 LAB — VITAMIN B12: Vitamin B-12: 730 pg/mL (ref 200–1100)

## 2017-10-29 DIAGNOSIS — Z961 Presence of intraocular lens: Secondary | ICD-10-CM | POA: Diagnosis not present

## 2017-10-31 DIAGNOSIS — R972 Elevated prostate specific antigen [PSA]: Secondary | ICD-10-CM | POA: Diagnosis not present

## 2017-10-31 LAB — PSA: PSA: 7

## 2017-11-10 DIAGNOSIS — N401 Enlarged prostate with lower urinary tract symptoms: Secondary | ICD-10-CM | POA: Diagnosis not present

## 2017-11-10 DIAGNOSIS — R3912 Poor urinary stream: Secondary | ICD-10-CM | POA: Diagnosis not present

## 2017-11-10 DIAGNOSIS — R972 Elevated prostate specific antigen [PSA]: Secondary | ICD-10-CM | POA: Diagnosis not present

## 2017-11-11 ENCOUNTER — Other Ambulatory Visit: Payer: Self-pay | Admitting: Urology

## 2017-11-11 DIAGNOSIS — R972 Elevated prostate specific antigen [PSA]: Secondary | ICD-10-CM

## 2017-11-19 ENCOUNTER — Encounter: Payer: Self-pay | Admitting: Family

## 2017-11-19 ENCOUNTER — Ambulatory Visit: Payer: 59 | Admitting: Family

## 2017-11-19 VITALS — BP 128/62 | HR 69 | Temp 98.1°F | Ht 67.0 in | Wt 159.0 lb

## 2017-11-19 DIAGNOSIS — J019 Acute sinusitis, unspecified: Secondary | ICD-10-CM

## 2017-11-19 MED ORDER — AMOXICILLIN-POT CLAVULANATE 875-125 MG PO TABS: 1.0000 | ORAL_TABLET | Freq: Two times a day (BID) | ORAL | 0 refills | Status: DC

## 2017-11-19 MED ORDER — FLUTICASONE PROPIONATE 50 MCG/ACT NA SUSP: 2.0000 | Freq: Every day | NASAL | 6 refills | Status: DC

## 2017-11-19 NOTE — Progress Notes (Signed)
Adam Gaines is a 72 y.o. male with the following history as recorded in EpicCare:  Patient Active Problem List   Diagnosis Date Noted  . Diastolic dysfunction 67/59/1638  . Nonrheumatic aortic valve insufficiency 06/12/2017  . Routine general medical examination at a health care facility 11/20/2015  . Vitamin D deficiency 10/26/2014  . Cardiovascular risk factor 03/08/2012  . Benign essential tremor 11/18/2011  . Hyperlipidemia with target LDL less than 100 07/24/2007    Current Outpatient Medications  Medication Sig Dispense Refill  . Ascorbic Acid (VITAMIN C PO) Take by mouth daily.    Marland Kitchen atorvastatin (LIPITOR) 20 MG tablet Take 1 tablet (20 mg total) by mouth daily. 90 tablet 3  . b complex vitamins tablet Take 1 tablet by mouth daily.    . Cholecalciferol (VITAMIN D) 2000 UNITS tablet Take 1 tablet (2,000 Units total) by mouth daily. 30 tablet 11  . TURMERIC PO Take 1 capsule by mouth daily.    Marland Kitchen amoxicillin-clavulanate (AUGMENTIN) 875-125 MG tablet Take 1 tablet by mouth 2 (two) times daily. 20 tablet 0  . fluticasone (FLONASE) 50 MCG/ACT nasal spray Place 2 sprays into both nostrils daily. 16 g 6   No current facility-administered medications for this visit.     Allergies: Patient has no known allergies.  Past Medical History:  Diagnosis Date  . Benign essential tremor 11/18/2011   Seen for this November 18, 2011   . Cardiovascular risk factor 03/08/2012  . Chickenpox   . Chronic eczema    of the plantar aspect right foot  . Colon polyps    Tubular Adenoma 2008  . Diverticulosis   . Intestinal angiodysplasia   . Lumbago   . Other and unspecified hyperlipidemia   . Personal history of other disorder of urinary system   . TONSILLECTOMY AND ADENOIDECTOMY, HX OF 07/24/2007   Qualifier: Diagnosis of  By: [place], [place]      Past Surgical History:  Procedure Laterality Date  . CATARACT EXTRACTION, BILATERAL    . left L5 hemilaminotomy with microdiskectomy    . repair  of deviated septum    . TONSILLECTOMY      Family History  Problem Relation Age of Onset  . Heart attack Mother 54       after aspiration  . Coronary artery disease Mother   . Heart disease Mother   . COPD Mother        smoker  . Coronary artery disease Father   . Heart disease Father   . Hyperlipidemia Father   . Hypertension Father   . Heart attack Father 84  . Diabetes Maternal Grandmother   . Other Other        cardiovascular disease  . Diabetes Paternal Grandmother   . Colon cancer Neg Hx     Social History   Tobacco Use  . Smoking status: Former Smoker    Last attempt to quit: 11/28/1978    Years since quitting: 39.0  . Smokeless tobacco: Never Used  Substance Use Topics  . Alcohol use: Yes    Alcohol/week: 3.0 oz    Types: 3 Glasses of wine, 2 Standard drinks or equivalent per week    Comment: social    Subjective:  Patient started with cold symptoms approximately 2 weeks ago- hoarseness, sore throat, congestion; sensation of mucus "in the back of throat" that cannot get rid of; has tried using OTC Mucinex DM with no benefit; did fly prior to onset of symptoms; denies any  chest pain or shortness of breath;   Objective:  Vitals:   11/19/17 0900  BP: 128/62  Pulse: 69  Temp: 98.1 F (36.7 C)  TempSrc: Oral  SpO2: 96%  Weight: 159 lb 0.6 oz (72.1 kg)  Height: 5\' 7"  (1.702 m)    General: Well developed, well nourished, in no acute distress  Skin : Warm and dry.  Head: Normocephalic and atraumatic  Eyes: Sclera and conjunctiva clear; pupils round and reactive to light; extraocular movements intact  Ears: External normal; canals clear; tympanic membranes normal  Oropharynx: Pink, supple. No suspicious lesions  Neck: Supple without thyromegaly, adenopathy  Lungs: Respirations unlabored; clear to auscultation bilaterally without wheeze, rales, rhonchi  CVS exam: normal rate and regular rhythm.  Neurologic: Alert and oriented; speech intact; face symmetrical;  moves all extremities well; CNII-XII intact without focal deficit   Assessment:  1. Acute sinusitis, recurrence not specified, unspecified location     Plan:  Rx for Augmentin 875 mg bid x 10 days, Flonase NS; increase fluids, rest; follow-up worse, no better.   No follow-ups on file.  No orders of the defined types were placed in this encounter.   Requested Prescriptions   Signed Prescriptions Disp Refills  . amoxicillin-clavulanate (AUGMENTIN) 875-125 MG tablet 20 tablet 0    Sig: Take 1 tablet by mouth 2 (two) times daily.  . fluticasone (FLONASE) 50 MCG/ACT nasal spray 16 g 6    Sig: Place 2 sprays into both nostrils daily.

## 2017-11-25 DIAGNOSIS — D2262 Melanocytic nevi of left upper limb, including shoulder: Secondary | ICD-10-CM | POA: Diagnosis not present

## 2017-11-25 DIAGNOSIS — L821 Other seborrheic keratosis: Secondary | ICD-10-CM | POA: Diagnosis not present

## 2017-11-25 DIAGNOSIS — L570 Actinic keratosis: Secondary | ICD-10-CM | POA: Diagnosis not present

## 2017-11-25 DIAGNOSIS — D2271 Melanocytic nevi of right lower limb, including hip: Secondary | ICD-10-CM | POA: Diagnosis not present

## 2017-11-25 DIAGNOSIS — C44619 Basal cell carcinoma of skin of left upper limb, including shoulder: Secondary | ICD-10-CM | POA: Diagnosis not present

## 2017-11-26 ENCOUNTER — Ambulatory Visit
Admission: RE | Admit: 2017-11-26 | Discharge: 2017-11-26 | Disposition: A | Discharge status: Home / Self Care | Payer: 59 | Source: Ambulatory Visit | Attending: Urology | Admitting: Urology

## 2017-11-26 DIAGNOSIS — R972 Elevated prostate specific antigen [PSA]: Secondary | ICD-10-CM | POA: Diagnosis not present

## 2017-11-26 MED ORDER — GADOBENATE DIMEGLUMINE 529 MG/ML IV SOLN
15.0000 mL | Freq: Once | INTRAVENOUS | Status: AC | PRN
  Administered 2017-11-26: 15 mL via INTRAVENOUS

## 2017-12-01 ENCOUNTER — Ambulatory Visit (INDEPENDENT_AMBULATORY_CARE_PROVIDER_SITE_OTHER)
Admission: RE | Admit: 2017-12-01 | Discharge: 2017-12-01 | Disposition: A | Discharge status: Home / Self Care | Payer: 59 | Source: Ambulatory Visit | Attending: Family | Admitting: Family

## 2017-12-01 ENCOUNTER — Other Ambulatory Visit: Payer: Self-pay | Admitting: Family

## 2017-12-01 ENCOUNTER — Encounter: Payer: Self-pay | Admitting: Family

## 2017-12-01 ENCOUNTER — Ambulatory Visit: Payer: 59 | Admitting: Family

## 2017-12-01 VITALS — BP 122/76 | HR 71 | Temp 98.0°F | Ht 67.0 in | Wt 159.0 lb

## 2017-12-01 DIAGNOSIS — R058 Other specified cough: Secondary | ICD-10-CM

## 2017-12-01 DIAGNOSIS — R05 Cough: Secondary | ICD-10-CM

## 2017-12-01 MED ORDER — METHYLPREDNISOLONE 4 MG PO TBPK: ORAL_TABLET | ORAL | 0 refills | Status: DC

## 2017-12-01 NOTE — Progress Notes (Signed)
Adam Gaines is a 72 y.o. male with the following history as recorded in [employer]:  Patient Active Problem List   Diagnosis Date Noted  . Diastolic dysfunction 40/98/1191  . Nonrheumatic aortic valve insufficiency 06/12/2017  . Routine general medical examination at a health care facility 11/20/2015  . Vitamin D deficiency 10/26/2014  . Cardiovascular risk factor 03/08/2012  . Benign essential tremor 11/18/2011  . Hyperlipidemia with target LDL less than 100 07/24/2007    Current Outpatient Medications  Medication Sig Dispense Refill  . amoxicillin-clavulanate (AUGMENTIN) 875-125 MG tablet Take 1 tablet by mouth 2 (two) times daily. 20 tablet 0  . Ascorbic Acid (VITAMIN C PO) Take by mouth daily.    Marland Kitchen atorvastatin (LIPITOR) 20 MG tablet Take 1 tablet (20 mg total) by mouth daily. 90 tablet 3  . b complex vitamins tablet Take 1 tablet by mouth daily.    . Cholecalciferol (VITAMIN D) 2000 UNITS tablet Take 1 tablet (2,000 Units total) by mouth daily. 30 tablet 11  . fluticasone (FLONASE) 50 MCG/ACT nasal spray Place 2 sprays into both nostrils daily. 16 g 6  . tamsulosin (FLOMAX) 0.4 MG CAPS capsule TK 1 C PO QD  0  . TURMERIC PO Take 1 capsule by mouth daily.     No current facility-administered medications for this visit.     Allergies: Patient has no known allergies.  Past Medical History:  Diagnosis Date  . Benign essential tremor 11/18/2011   Seen for this November 18, 2011   . Cardiovascular risk factor 03/08/2012  . Chickenpox   . Chronic eczema    of the plantar aspect right foot  . Colon polyps    Tubular Adenoma 2008  . Diverticulosis   . Intestinal angiodysplasia   . Lumbago   . Other and unspecified hyperlipidemia   . Personal history of other disorder of urinary system   . TONSILLECTOMY AND ADENOIDECTOMY, HX OF 07/24/2007   Qualifier: Diagnosis of  By: [place], [place]      Past Surgical History:  Procedure Laterality Date  . CATARACT EXTRACTION, BILATERAL     . left L5 hemilaminotomy with microdiskectomy    . repair of deviated septum    . TONSILLECTOMY      Family History  Problem Relation Age of Onset  . Heart attack Mother 63       after aspiration  . Coronary artery disease Mother   . Heart disease Mother   . COPD Mother        smoker  . Coronary artery disease Father   . Heart disease Father   . Hyperlipidemia Father   . Hypertension Father   . Heart attack Father 47  . Diabetes Maternal Grandmother   . Other Other        cardiovascular disease  . Diabetes Paternal Grandmother   . Colon cancer Neg Hx     Social History   Tobacco Use  . Smoking status: Former Smoker    Last attempt to quit: 11/28/1978    Years since quitting: 39.0  . Smokeless tobacco: Never Used  Substance Use Topics  . Alcohol use: Yes    Alcohol/week: 3.0 oz    Types: 3 Glasses of wine, 2 Standard drinks or equivalent per week    Comment: social    Subjective:  Seen on 11/19/2017 with sinus infection; has completed 10 days of Augmentin and feels better but symptoms not resolved completely; "still feels like mucus just hanging out in the  back of the throat." Has continued to use Flonase; coughing spells are improved; denies any chest pain or shortness of breath;   Objective:  Vitals:   12/01/17 0819  BP: 122/76  Pulse: 71  Temp: 98 F (36.7 C)  TempSrc: Oral  SpO2: 96%  Weight: 159 lb 0.6 oz (72.1 kg)  Height: 5\' 7"  (1.702 m)    General: Well developed, well nourished, in no acute distress  Skin : Warm and dry.  Head: Normocephalic and atraumatic  Eyes: Sclera and conjunctiva clear; pupils round and reactive to light; extraocular movements intact  Ears: External normal; canals clear; tympanic membranes normal  Oropharynx: Pink, supple. No suspicious lesions  Neck: Supple without thyromegaly, adenopathy  Lungs: Respirations unlabored; clear to auscultation bilaterally without wheeze, rales, rhonchi  CVS exam: normal rate and regular rhythm.   Neurologic: Alert and oriented; speech intact; face symmetrical; moves all extremities well; CNII-XII intact without focal deficit   Assessment:  1. Cough present for greater than 3 weeks     Plan:  Update CXR today due to length of time symptoms have been present; follow up to be determined based on X-ray results; will most likely treat with prednisone pack;   No follow-ups on file.  Orders Placed This Encounter  Procedures  . DG Chest 2 View    Standing Status:   Future    Number of Occurrences:   1    Standing Expiration Date:   02/02/2019    Order Specific Question:   Reason for Exam (SYMPTOM  OR DIAGNOSIS REQUIRED)    Answer:   cough x 3 weeks    Order Specific Question:   Preferred imaging location?    Answer:   [facility]    Order Specific Question:   Radiology Contrast Protocol - do NOT remove file path    Answer:   [email or web address]    Requested Prescriptions    No prescriptions requested or ordered in this encounter

## 2017-12-02 DIAGNOSIS — R972 Elevated prostate specific antigen [PSA]: Secondary | ICD-10-CM | POA: Diagnosis not present

## 2017-12-03 DIAGNOSIS — D692 Other nonthrombocytopenic purpura: Secondary | ICD-10-CM | POA: Diagnosis not present

## 2017-12-03 DIAGNOSIS — Z85828 Personal history of other malignant neoplasm of skin: Secondary | ICD-10-CM | POA: Diagnosis not present

## 2017-12-09 ENCOUNTER — Encounter: Payer: Self-pay | Admitting: Internal Medicine

## 2017-12-11 ENCOUNTER — Other Ambulatory Visit (INDEPENDENT_AMBULATORY_CARE_PROVIDER_SITE_OTHER): Payer: 59

## 2017-12-11 ENCOUNTER — Encounter: Payer: Self-pay | Admitting: Internal Medicine

## 2017-12-11 ENCOUNTER — Ambulatory Visit: Payer: 59 | Admitting: Internal Medicine

## 2017-12-11 VITALS — BP 120/80 | HR 65 | Temp 98.4°F | Resp 16 | Ht 67.0 in | Wt 159.0 lb

## 2017-12-11 DIAGNOSIS — M542 Cervicalgia: Secondary | ICD-10-CM | POA: Diagnosis not present

## 2017-12-11 DIAGNOSIS — C61 Malignant neoplasm of prostate: Secondary | ICD-10-CM

## 2017-12-11 DIAGNOSIS — J301 Allergic rhinitis due to pollen: Secondary | ICD-10-CM | POA: Diagnosis not present

## 2017-12-11 LAB — BASIC METABOLIC PANEL
BUN: 22 mg/dL (ref 6–23)
CALCIUM: 9.6 mg/dL (ref 8.4–10.5)
CO2: 30 mEq/L (ref 19–32)
Chloride: 104 mEq/L (ref 96–112)
Creatinine, Ser: 0.94 mg/dL (ref 0.40–1.50)
GFR: 83.34 mL/min (ref >60.00)
GLUCOSE: 100 mg/dL — AB (ref 70–99)
POTASSIUM: 4.9 meq/L (ref 3.5–5.1)
SODIUM: 139 meq/L (ref 135–145)

## 2017-12-11 LAB — CBC WITH DIFFERENTIAL/PLATELET
BASOS ABS: 0.1 10*3/uL (ref 0.0–0.1)
BASOS PCT: 1.3 % (ref 0.0–3.0)
Eosinophils Absolute: 0.2 10*3/uL (ref 0.0–0.7)
Eosinophils Relative: 4 % (ref 0.0–5.0)
HEMATOCRIT: 43.2 % (ref 39.0–52.0)
Hemoglobin: 14.6 g/dL (ref 13.0–17.0)
LYMPHS ABS: 1.7 10*3/uL (ref 0.7–4.0)
Lymphocytes Relative: 30.8 % (ref 12.0–46.0)
MCHC: 33.7 g/dL (ref 30.0–36.0)
MCV: 93.8 fl (ref 78.0–100.0)
MONOS PCT: 9.6 % (ref 3.0–12.0)
Monocytes Absolute: 0.5 10*3/uL (ref 0.1–1.0)
NEUTROS ABS: 3 10*3/uL (ref 1.4–7.7)
NEUTROS PCT: 54.3 % (ref 43.0–77.0)
PLATELETS: 186 10*3/uL (ref 150.0–400.0)
RBC: 4.61 Mil/uL (ref 4.22–5.81)
RDW: 13.4 % (ref 11.5–15.5)
WBC: 5.6 10*3/uL (ref 4.0–10.5)

## 2017-12-11 LAB — SEDIMENTATION RATE: Sed Rate: 7 mm/hr (ref 0–20)

## 2017-12-11 LAB — PSA: PSA: 19.68 ng/mL — ABNORMAL HIGH (ref 0.10–4.00)

## 2017-12-11 MED ORDER — LEVOCETIRIZINE DIHYDROCHLORIDE 5 MG PO TABS: 5.0000 mg | ORAL_TABLET | Freq: Every evening | ORAL | 3 refills | Status: DC

## 2017-12-11 NOTE — Progress Notes (Signed)
Subjective:  Patient ID: Adam Gaines, male    DOB: 1946/02/12  Age: 72 y.o. MRN: [id number]  CC: Allergic Rhinitis    HPI Adam Gaines presents for f/up - He recently saw another provider for URI symptoms and completed a course of Augmentin and steroids but tells me the symptoms persist.  He complains of persistent sore throat with a sensation of irritation in his throat and diffuse anterior neck pain.  He complains of nasal congestion, postnasal drip, runny nose, difficulty swallowing, and nonproductive cough.  Since I last saw him he has been diagnosed with prostate cancer.  He is also status post tonsillectomy.  Outpatient Medications Prior to Visit  Medication Sig Dispense Refill  . Ascorbic Acid (VITAMIN C PO) Take by mouth daily.    Marland Kitchen atorvastatin (LIPITOR) 20 MG tablet Take 1 tablet (20 mg total) by mouth daily. 90 tablet 3  . b complex vitamins tablet Take 1 tablet by mouth daily.    . Cholecalciferol (VITAMIN D) 2000 UNITS tablet Take 1 tablet (2,000 Units total) by mouth daily. 30 tablet 11  . TURMERIC PO Take 1 capsule by mouth daily.    Marland Kitchen levofloxacin (LEVAQUIN) 750 MG tablet     . tamsulosin (FLOMAX) 0.4 MG CAPS capsule TK 1 C PO QD  0  . amoxicillin-clavulanate (AUGMENTIN) 875-125 MG tablet Take 1 tablet by mouth 2 (two) times daily. 20 tablet 0  . fluticasone (FLONASE) 50 MCG/ACT nasal spray Place 2 sprays into both nostrils daily. 16 g 6  . methylPREDNISolone (MEDROL DOSEPAK) 4 MG TBPK tablet Taper as directed 21 tablet 0   No facility-administered medications prior to visit.     ROS Review of Systems  Constitutional: Negative for chills, diaphoresis, fatigue and fever.  HENT: Positive for congestion, postnasal drip, rhinorrhea, sore throat and trouble swallowing. Negative for facial swelling, sinus pressure, sinus pain and voice change.   Eyes: Negative.   Respiratory: Positive for cough. Negative for chest tightness, shortness of breath and wheezing.     Cardiovascular: Negative for chest pain, palpitations and leg swelling.  Gastrointestinal: Negative for abdominal pain, constipation, diarrhea, nausea and vomiting.  Endocrine: Negative.   Genitourinary: Negative.  Negative for difficulty urinating.  Musculoskeletal: Negative.  Negative for arthralgias.  Skin: Negative.  Negative for color change and rash.  Allergic/Immunologic: Negative.   Neurological: Negative.  Negative for dizziness and weakness.  Hematological: Negative for adenopathy. Does not bruise/bleed easily.  Psychiatric/Behavioral: Negative.     Objective:  BP 120/80 (BP Location: Left Arm, Patient Position: Sitting, Cuff Size: Normal)   Pulse 65   Temp 98.4 F (36.9 C) (Oral)   Resp 16   Ht 5\' 7"  (1.702 m)   Wt 159 lb (72.1 kg)   SpO2 96%   BMI 24.90 kg/m   BP Readings from Last 3 Encounters:  12/11/17 120/80  12/01/17 122/76  11/19/17 128/62    Wt Readings from Last 3 Encounters:  12/11/17 159 lb (72.1 kg)  12/01/17 159 lb 0.6 oz (72.1 kg)  11/19/17 159 lb 0.6 oz (72.1 kg)    Physical Exam  Constitutional: He is oriented to person, place, and time. No distress.  HENT:  Right Ear: Hearing, tympanic membrane, external ear and ear canal normal.  Left Ear: Hearing, tympanic membrane, external ear and ear canal normal.  Nose: Mucosal edema present. No rhinorrhea, sinus tenderness or nasal septal hematoma. Right sinus exhibits no maxillary sinus tenderness and no frontal sinus tenderness. Left sinus exhibits  no maxillary sinus tenderness and no frontal sinus tenderness.  Mouth/Throat: Oropharynx is clear and moist and mucous membranes are normal. Mucous membranes are not pale, not dry and not cyanotic. No oral lesions. No uvula swelling. No oropharyngeal exudate, posterior oropharyngeal edema, posterior oropharyngeal erythema or tonsillar abscesses. Tonsils are 0 on the right. Tonsils are 0 on the left.  Eyes: Conjunctivae are normal. No scleral icterus.  Neck:  Normal range of motion. Neck supple.  Cardiovascular: Normal rate, regular rhythm and normal heart sounds.  No murmur heard. Pulmonary/Chest: Effort normal and breath sounds normal. No stridor. He has no wheezes. He has no rales.  Abdominal: Soft. Bowel sounds are normal. He exhibits no mass. There is no tenderness.  Musculoskeletal: Normal range of motion. He exhibits no edema, tenderness or deformity.  Lymphadenopathy:       Head (right side): No submental, no submandibular, no tonsillar and no occipital adenopathy present.       Head (left side): No submental, no submandibular, no tonsillar and no occipital adenopathy present.    He has no cervical adenopathy.    He has no axillary adenopathy.       Right: No supraclavicular adenopathy present.       Left: No supraclavicular adenopathy present.  Neurological: He is alert and oriented to person, place, and time.  Skin: Skin is warm and dry. No rash noted. He is not diaphoretic.  Vitals reviewed.   Lab Results  Component Value Date   WBC 5.6 12/11/2017   HGB 14.6 12/11/2017   HCT 43.2 12/11/2017   PLT 186.0 12/11/2017   GLUCOSE 100 (H) 12/11/2017   CHOL 194 04/30/2017   TRIG 76.0 04/30/2017   HDL 91.20 04/30/2017   LDLCALC 87 04/30/2017   ALT 29 04/30/2017   AST 34 04/30/2017   NA 139 12/11/2017   K 4.9 12/11/2017   CL 104 12/11/2017   CREATININE 0.94 12/11/2017   BUN 22 12/11/2017   CO2 30 12/11/2017   TSH 1.74 04/30/2017   PSA 19.68 Repeated and verified X2. (H) 12/11/2017   HGBA1C 5.3 11/09/2014    Dg Chest 2 View  Result Date: 12/01/2017 CLINICAL DATA:  Cough and sore throat EXAM: CHEST - 2 VIEW COMPARISON:  None. FINDINGS: Cardiac shadow is within normal limits. The lungs are well aerated bilaterally. Calcific density is noted over the anterior aspect of the left second rib as well as some other scattered calcifications consistent with prior granulomatous disease. No acute bony abnormality is noted. IMPRESSION:  Changes of prior granulomatous disease without acute abnormality. Electronically Signed   By: Alcide Clever M.D.   On: 12/01/2017 09:04    Assessment & Plan:   Onesimo was seen today for allergic rhinitis .  Diagnoses and all orders for this visit:  Anterior neck pain- His exam is unremarkable and his lab work is negative for any concerns about malignancy, infection, or inflammation. He will undergo a CT with contrast to see if there is a mass, cyst, or infection that would explain his sx's -     CBC with Differential/Platelet; Future -     Basic metabolic panel; Future -     Sedimentation rate; Future  Prostate cancer (HCC)- His PSA is up to 20.  He will discuss treatment options with his urologist soon. -     PSA; Future  Seasonal allergic rhinitis due to pollen -     levocetirizine (XYZAL) 5 MG tablet; Take 1 tablet (5 mg total) by mouth  every evening.   I have discontinued Devontaye Edghill. Harold's amoxicillin-clavulanate, fluticasone, tamsulosin, methylPREDNISolone, and levofloxacin. I am also having him start on levocetirizine. Additionally, I am having him maintain his Vitamin D, b complex vitamins, TURMERIC PO, Ascorbic Acid (VITAMIN C PO), and atorvastatin.  Meds ordered this encounter  Medications  . levocetirizine (XYZAL) 5 MG tablet    Sig: Take 1 tablet (5 mg total) by mouth every evening.    Dispense:  30 tablet    Refill:  3     Follow-up: No follow-ups on file.  Sanda Linger, MD

## 2017-12-12 ENCOUNTER — Encounter: Payer: Self-pay | Admitting: Internal Medicine

## 2017-12-12 ENCOUNTER — Encounter (INDEPENDENT_AMBULATORY_CARE_PROVIDER_SITE_OTHER): Payer: Self-pay

## 2017-12-12 NOTE — Patient Instructions (Signed)

## 2017-12-18 ENCOUNTER — Encounter: Payer: Self-pay | Admitting: Radiation Oncology

## 2017-12-18 ENCOUNTER — Ambulatory Visit (INDEPENDENT_AMBULATORY_CARE_PROVIDER_SITE_OTHER)
Admission: RE | Admit: 2017-12-18 | Discharge: 2017-12-18 | Disposition: A | Discharge status: Home / Self Care | Payer: 59 | Source: Ambulatory Visit | Attending: Internal Medicine | Admitting: Internal Medicine

## 2017-12-18 ENCOUNTER — Encounter: Payer: Self-pay | Admitting: Internal Medicine

## 2017-12-18 DIAGNOSIS — C61 Malignant neoplasm of prostate: Secondary | ICD-10-CM

## 2017-12-18 DIAGNOSIS — M542 Cervicalgia: Secondary | ICD-10-CM

## 2017-12-18 DIAGNOSIS — M47812 Spondylosis without myelopathy or radiculopathy, cervical region: Secondary | ICD-10-CM

## 2017-12-18 HISTORY — DX: Spondylosis without myelopathy or radiculopathy, cervical region: M47.812

## 2017-12-18 MED ORDER — IOPAMIDOL (ISOVUE-300) INJECTION 61%
75.0000 mL | Freq: Once | INTRAVENOUS | Status: AC | PRN
  Administered 2017-12-18: 75 mL via INTRAVENOUS

## 2018-01-12 ENCOUNTER — Encounter: Payer: Self-pay | Admitting: Radiation Oncology

## 2018-01-12 NOTE — Progress Notes (Signed)
GU Location of Tumor / Histology: prostatic adenocarcinoma  If Prostate Cancer, Gleason Score is (3 + 4) and PSA is (7.0). Prostate volume: 35 grams.   Adam Gaines was a long standing patient of Dr. Gaynelle Arabian. When Dr. Gaynelle Arabian retired he transitioned to Dr. Louis Meckel.   09/2017  PSA  6.29 09/2016  PSA  4.16 09/2015  PSA  4.41 09/2014  PSA  3.14 03/2014 PSA  3.95 03/2013 PSA  2.37  Biopsies of prostate (if applicable) revealed:    Past/Anticipated interventions by urology, if any: biopsy, referral to radiation oncology to discuss brachytherapy and for a second opinion.   Past/Anticipated interventions by medical oncology, if any: no  Weight changes, if any: no  Bowel/Bladder complaints, if any: Reports nocturia x 2, urinary frequency, and feelings of incomplete emptying. Reports urgency. Denies hematuria or dysuria. Denies urinary leakage or incontinence.  IPSS 11. SHIM 11.  Nausea/Vomiting, if any: no  Pain issues, if any:  Reports chronic back pain but denies any new onset of pain.   SAFETY ISSUES:  Prior radiation? no  Pacemaker/ICD? no  Possible current pregnancy? no  Is the patient on methotrexate? 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

## 2018-01-13 DIAGNOSIS — C61 Malignant neoplasm of prostate: Secondary | ICD-10-CM | POA: Insufficient documentation

## 2018-01-14 ENCOUNTER — Encounter: Payer: Self-pay | Admitting: Radiation Oncology

## 2018-01-14 ENCOUNTER — Other Ambulatory Visit: Payer: Self-pay

## 2018-01-14 ENCOUNTER — Ambulatory Visit
Admission: RE | Admit: 2018-01-14 | Discharge: 2018-01-14 | Disposition: A | Discharge status: Home / Self Care | Payer: 59 | Source: Ambulatory Visit | Attending: Radiation Oncology | Admitting: Radiation Oncology

## 2018-01-14 ENCOUNTER — Encounter: Payer: Self-pay | Admitting: Medical Oncology

## 2018-01-14 DIAGNOSIS — C61 Malignant neoplasm of prostate: Secondary | ICD-10-CM

## 2018-01-14 DIAGNOSIS — Z87891 Personal history of nicotine dependence: Secondary | ICD-10-CM | POA: Diagnosis not present

## 2018-01-14 DIAGNOSIS — Z79899 Other long term (current) drug therapy: Secondary | ICD-10-CM | POA: Diagnosis not present

## 2018-01-14 DIAGNOSIS — R972 Elevated prostate specific antigen [PSA]: Secondary | ICD-10-CM | POA: Diagnosis not present

## 2018-01-14 HISTORY — DX: Malignant neoplasm of prostate: C61

## 2018-01-14 NOTE — Progress Notes (Signed)
Radiation Oncology         [phone] ________________________________  Initial Outpatient Consultation  Name: Adam Gaines MRN: [id number]  Date: 01/14/2018  DOB: 1945-11-08  BM:WUXLK, Bernadene Bell, MD  Crist Fat, MD   REFERRING PHYSICIAN: Crist Fat, MD  DIAGNOSIS: 72 y.o. gentleman with Stage T1c adenocarcinoma of the prostate with Gleason Score of 3+4, and PSA of 19.68    ICD-10-CM   1. Malignant neoplasm of prostate (HCC) C61     HISTORY OF PRESENT ILLNESS: Adam QUISPE "Bernette Redbird" is a 72 y.o. male with a diagnosis of prostate cancer. He was an established patient of Dr. Imelda Pillow and was referred to Dr. Marlou Porch upon Dr. Imelda Pillow retirement.  He has been under active surveillance with Dr. Marlou Porch for elevated PSA  since.  More recently, he was noted to have an elevated PSA of 6.29 by his primary care physician, Dr. Yetta Barre, in 09/2017.  Accordingly, he was referred back to Dr. Marlou Porch on 10/31/17 for evaluation in urology, where a digital rectal examination was performed at that time revealing symmetrical prostate lobes without discrete nodularity.  A repeat PSA was obtained at that time and was further elevated to 7.0.  He had an MRI prostate on 11/26/2017 which revealed a 9 mm area of subtle enhancement in the left lateral mid to apex without evidence of extracapsular extension, lymphadenopathy, or skeletal metastasis.  The patient proceeded to MRI fusion transrectal ultrasound with 12 biopsies of the prostate on 12/02/17.  He had not had prior TRUSPBx.  The prostate volume measured 35 cc.  Out of 12 core biopsies, 2 were positive.  The maximum Gleason score was 3+4, and this was seen in the left mid lateral and right mid gland.  All 4 region of interest samples revealed atypia only, and this was confirmed after being sent out for review at [facility].  Biopsies of prostate revealed:    The patient reviewed the biopsy results with his urologist and he has kindly  been referred today for discussion of potential radiation treatment options.  The patient had a repeat PSA, per his own request, at the time of routine labwork on 12/11/2017, one week after prostate biopsy, which was 19.68 but not felt to be reliable given his recent biopsy.  PSA History 12/11/2017: 19.68 10/31/2017: 7.00 09/22/2017: 6.29 09/17/2016: 4.16 09/15/2015: 4.47 09/12/2014: 3.14   PREVIOUS RADIATION THERAPY: No  PAST MEDICAL HISTORY:  Past Medical History:  Diagnosis Date  . Benign essential tremor 11/18/2011   Seen for this November 18, 2011   . Cardiovascular risk factor 03/08/2012  . Chickenpox   . Chronic eczema    of the plantar aspect right foot  . Colon polyps    Tubular Adenoma 2008  . Diverticulosis   . Intestinal angiodysplasia   . Lumbago   . Other and unspecified hyperlipidemia   . Personal history of other disorder of urinary system   . Prostate cancer (HCC)   . TONSILLECTOMY AND ADENOIDECTOMY, HX OF 07/24/2007   Qualifier: Diagnosis of  By: Genelle Gather CMA, [place]        PAST SURGICAL HISTORY: Past Surgical History:  Procedure Laterality Date  . CATARACT EXTRACTION, BILATERAL    . left L5 hemilaminotomy with microdiskectomy    . repair of deviated septum    . TONSILLECTOMY      FAMILY HISTORY:  Family History  Problem Relation Age of Onset  . Heart attack Mother 67       after  aspiration  . Coronary artery disease Mother   . Heart disease Mother   . COPD Mother        smoker  . Coronary artery disease Father   . Heart disease Father   . Hyperlipidemia Father   . Hypertension Father   . Heart attack Father 59  . Diabetes Maternal Grandmother   . Cancer Maternal Grandmother        unknown/lived to age 18  . Other Other        cardiovascular disease  . Diabetes Paternal Grandmother   . Colon cancer Neg Hx     SOCIAL HISTORY:  Social History   Socioeconomic History  . Marital status: Married    Spouse name: Not on file  . Number of  children: 2  . Years of education: 29  . Highest education level: Not on file  Occupational History  . Occupation: Environmental consultant: Artist    Comment: attorney, business law  Social Needs  . Financial resource strain: Not on file  . Food insecurity:    Worry: Not on file    Inability: Not on file  . Transportation needs:    Medical: Not on file    Non-medical: Not on file  Tobacco Use  . Smoking status: Former Smoker    Years: 10.00    Types: Cigarettes    Last attempt to quit: 11/28/1978    Years since quitting: 39.1  . Smokeless tobacco: Never Used  Substance and Sexual Activity  . Alcohol use: Yes    Alcohol/week: 5.0 standard drinks    Types: 3 Glasses of wine, 2 Standard drinks or equivalent per week    Comment: social  . Drug use: No  . Sexual activity: Yes    Partners: Female  Lifestyle  . Physical activity:    Days per week: Not on file    Minutes per session: Not on file  . Stress: Not on file  Relationships  . Social connections:    Talks on phone: Not on file    Gets together: Not on file    Attends religious service: Not on file    Active member of club or organization: Not on file    Attends meetings of clubs or organizations: Not on file    Relationship status: Not on file  . Intimate partner violence:    Fear of current or ex partner: No    Emotionally abused: No    Physically abused: No    Forced sexual activity: No  Other Topics Concern  . Not on file  Social History Narrative   [employer], [place] [employer] [place] JD '70. 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 Marriage '67 and  in good health. 2 dtrs - '69, '72; 6 grandchildren. Work - Designer, jewellery.                      ALLERGIES: Patient has no known allergies.  MEDICATIONS:  Current Outpatient Medications  Medication Sig Dispense Refill  . Ascorbic Acid (VITAMIN C PO) Take 1,000 mg by mouth daily.     Marland Kitchen atorvastatin (LIPITOR) 20 MG tablet Take 1 tablet (20 mg  total) by mouth daily. 90 tablet 3  . b complex vitamins tablet Take 1 tablet by mouth daily.    . Cholecalciferol (VITAMIN D) 2000 UNITS tablet Take 1 tablet (2,000 Units total) by mouth daily. 30 tablet 11  . tamsulosin (FLOMAX) 0.4 MG  CAPS capsule Take 0.4 mg by mouth.    . TURMERIC PO Take 1 capsule by mouth daily.     No current facility-administered medications for this encounter.     REVIEW OF SYSTEMS:  On review of systems, the patient reports that he is doing well overall and continues to practice full time, locally as an attorney. He denies any chest pain, shortness of breath, cough, fevers, chills, night sweats, or unintended weight changes. He denies any bowel disturbances, and denies abdominal pain, nausea or vomiting. He reports chronic back pain but denies any new musculoskeletal or joint aches or pains. His IPSS was 11, indicating moderate urinary symptoms with frequency, urgency, weak stream, and nocturia x2. He was recently started on Flomax once at night for his urinary symptoms and has noticed some improvement in his FOS and reduced urgency. He denies dysuria, hematuria, leakage or incontinence. His SHIM score is 11, indicating he is able to complete sexual activity with about half attempts- aided by Viagra. A complete review of systems is obtained and is otherwise negative.    PHYSICAL EXAM:  Wt Readings from Last 3 Encounters:  01/14/18 158 lb (71.7 kg)  12/11/17 159 lb (72.1 kg)  12/01/17 159 lb 0.6 oz (72.1 kg)   Temp Readings from Last 3 Encounters:  01/14/18 98.1 F (36.7 C) (Oral)  12/11/17 98.4 F (36.9 C) (Oral)  12/01/17 98 F (36.7 C) (Oral)   BP Readings from Last 3 Encounters:  01/14/18 132/81  12/11/17 120/80  12/01/17 122/76   Pulse Readings from Last 3 Encounters:  01/14/18 62  12/11/17 65  12/01/17 71    /10  In general this is a well appearing caucasian male in no acute distress. He's alert and oriented x4 and appropriate throughout the  examination. Cardiopulmonary assessment is negative for acute distress and he exhibits normal effort. Abdomen is soft, non-tender and non-distended.  There is no pitting edema in the LEs bilaterally.   KPS =  100  100 - Normal; no complaints; no evidence of disease. 90   - Able to carry on normal activity; minor signs or symptoms of disease. 80   - Normal activity with effort; some signs or symptoms of disease. 45   - Cares for self; unable to carry on normal activity or to do active work. 60   - Requires occasional assistance, but is able to care for most of his personal needs. 50   - Requires considerable assistance and frequent medical care. 40   - Disabled; requires special care and assistance. 30   - Severely disabled; hospital admission is indicated although death not imminent. 20   - Very sick; hospital admission necessary; active supportive treatment necessary. 10   - Moribund; fatal processes progressing rapidly. 0     - Dead  Karnofsky DA, Abelmann WH, Craver LS and Burchenal [facility] [phone]) The use of the nitrogen mustards in the palliative treatment of carcinoma: with particular reference to bronchogenic carcinoma Cancer 1 [phone]  LABORATORY DATA:  Lab Results  Component Value Date   WBC 5.6 12/11/2017   HGB 14.6 12/11/2017   HCT 43.2 12/11/2017   MCV 93.8 12/11/2017   PLT 186.0 12/11/2017   Lab Results  Component Value Date   NA 139 12/11/2017   K 4.9 12/11/2017   CL 104 12/11/2017   CO2 30 12/11/2017   Lab Results  Component Value Date   ALT 29 04/30/2017   AST 34 04/30/2017   ALKPHOS 41 04/30/2017  BILITOT 0.6 04/30/2017     RADIOGRAPHY: Ct Soft Tissue Neck W Contrast  Result Date: 12/18/2017 CLINICAL DATA:  Anterior neck pain. History of prostate cancer. Evaluate for malignancy. EXAM: CT NECK WITH CONTRAST TECHNIQUE: Multidetector CT imaging of the neck was performed using the standard protocol following the bolus administration of intravenous contrast.  CONTRAST:  75mL ISOVUE-300 IOPAMIDOL (ISOVUE-300) INJECTION 61% COMPARISON:  None. FINDINGS: Pharynx and larynx: Normal. No mass or swelling. Salivary glands: No inflammation, mass, or stone. Thyroid: Negative Lymph nodes: Negative for enlarged cervical lymph nodes. Vascular: Normal vascular enhancement Limited intracranial: Negative Visualized orbits: Bilateral cataract surgery. Negative for orbital mass. Mastoids and visualized paranasal sinuses: Negative Skeleton: Moderate to advanced cervical spondylosis with multilevel disc and facet degeneration. Anterolisthesis C4-5 and C5-6. No bony lesion identified Upper chest: Lung apices clear bilaterally Other: None IMPRESSION: No acute abnormality in the neck.  No mass or adenopathy. Advanced cervical spondylosis. Electronically Signed   By: Marlan Palau M.D.   On: 12/18/2017 11:28      IMPRESSION/PLAN: 1. 72 y.o. gentleman with Stage T1c adenocarcinoma of the prostate with Gleason Score of 3+4, and PSA of 19.68.  Notably, this PSA level was performed immediately after TRUS with Biopsies, and should be considered spurious reflecting prostate trauma rather than a reflection of cancer volume/prognosis.  His PSA otherwise has been less than 10. We discussed the patient's workup and outlined the nature of prostate cancer in this setting. The patient's T stage, Gleason's score, and PSA put him into the favorable intermediate risk group. Accordingly, he is eligible for a variety of potential treatment options including brachytherapy or 5-1/2 weeks of external radiation. We also discussed the rationale behind AS given his low volume, favorable intermediate risk disease, understanding that there is a real, albeit low, risk of disease progression in postponing treatment with AS.  He appears to have a good understanding of his disease and our recommendation and is willing to accept these risks. We discussed the available radiation techniques, and focused on the details of  logistics and delivery.  We discussed and outlined the risks, benefits, short and long-term effects associated with radiotherapy and compared and contrasted these with prostatectomy.  At the end of the conversation, the patient is interested in continuing in AS with plans to proceed with repeat TRUSPBx in 06/2018.  Pending findings on repeat biopsy, he is leaning towards proceeding with brachytherapy at that time. He is tentatively scheduled for repeat prostate biopsy in January 2020 but would like to speak with Dr. Marlou Porch to discuss potentially moving this up sooner. We will share our discussion with Dr. Marlou Porch and will remain in close communication regarding treatment planning.   We enjoyed meeting him and his wife today and look forward to following his progress and participating in his care in the near future.    Marguarite Arbour, PA-C    Margaretmary Dys, MD  [facility]  Radiation Oncology Direct Dial: [phone]  Fax: [phone] [email or web address]  Skype  [email or web address]  This document serves as a record of services personally performed by Margaretmary Dys, MD and Marcello Fennel, PA-C. It was created on their behalf by Ivar Bury, a trained medical scribe. The creation of this record is based on the scribe's personal observations and the providers' statements to them. This document has been checked and approved by the attending providers.

## 2018-01-14 NOTE — Progress Notes (Signed)
See progress note under physician encounter. 

## 2018-01-15 ENCOUNTER — Encounter: Payer: Self-pay | Admitting: Urology

## 2018-01-15 NOTE — Progress Notes (Signed)
Per inbox message from Dr. Louis Meckel, this gentleman has elected to proceed with Brachytherapy and SpaceOAR with Dr. Louis Meckel.  I have requested Enid Derry reach out to [facility] at [facility] to coordinate the procedure for October, hopefully after his visit with his granddaughter in [place]. :)   Nicholos Johns, PA-C

## 2018-01-22 ENCOUNTER — Other Ambulatory Visit: Payer: Self-pay | Admitting: Urology

## 2018-01-27 ENCOUNTER — Telehealth: Payer: Self-pay | Admitting: *Deleted

## 2018-01-27 NOTE — Telephone Encounter (Signed)
CALLED PATIENT TO INFORM OF PRE-SEED PLANNING CT , CHEST X-RAY AND EKG FOR 02-06-18, AND HIS IMPLANT ON 03-11-18, LVM FOR A RETURN CALL

## 2018-02-05 ENCOUNTER — Telehealth: Payer: Self-pay | Admitting: *Deleted

## 2018-02-05 NOTE — Telephone Encounter (Signed)
CALLED PATIENT TO REMIND OF CHEST X-RAY AND EKG AND PRE-SEED APPTS., LVM FOR A RETURN CLAL

## 2018-02-06 ENCOUNTER — Encounter: Payer: Self-pay | Admitting: Medical Oncology

## 2018-02-06 ENCOUNTER — Ambulatory Visit: Admission: RE | Admit: 2018-02-06 | Payer: 59 | Source: Ambulatory Visit | Admitting: Radiation Oncology

## 2018-02-06 ENCOUNTER — Ambulatory Visit
Admission: RE | Admit: 2018-02-06 | Discharge: 2018-02-06 | Disposition: A | Discharge status: Home / Self Care | Payer: 59 | Source: Ambulatory Visit | Attending: Radiation Oncology | Admitting: Radiation Oncology

## 2018-02-06 ENCOUNTER — Inpatient Hospital Stay (HOSPITAL_COMMUNITY): Admission: RE | Admit: 2018-02-06 | Payer: 59 | Source: Ambulatory Visit

## 2018-02-06 ENCOUNTER — Inpatient Hospital Stay
Admission: RE | Admit: 2018-02-06 | Discharge: 2018-02-06 | Disposition: A | Discharge status: Home / Self Care | Payer: 59 | Source: Ambulatory Visit | Attending: Radiation Oncology | Admitting: Radiation Oncology

## 2018-02-06 DIAGNOSIS — C61 Malignant neoplasm of prostate: Secondary | ICD-10-CM

## 2018-02-06 NOTE — Progress Notes (Signed)
Radiation Oncology         [phone] ________________________________  Name: Adam Gaines MRN: [id number]  Date: 02/06/2018  DOB: 20-Apr-1946  SIMULATION AND TREATMENT PLANNING NOTE PUBIC ARCH STUDY  WJ:XBJYN, Bernadene Bell, MD  Crist Fat, MD  DIAGNOSIS: 72 y.o. gentleman with Stage T1c adenocarcinoma of the prostate with Gleason Score of 3+4, and PSA of 19.68     ICD-10-CM   1. Prostate cancer (HCC) C61   2. Malignant neoplasm of prostate (HCC) C61     COMPLEX SIMULATION:  The patient presented today for evaluation for possible prostate seed implant. He was brought to the radiation planning suite and placed supine on the CT couch. A 3-dimensional image study set was obtained in upload to the planning computer. There, on each axial slice, I contoured the prostate gland. Then, using three-dimensional radiation planning tools I reconstructed the prostate in view of the structures from the transperineal needle pathway to assess for possible pubic arch interference. In doing so, I did not appreciate any pubic arch interference. Also, the patient's prostate volume was estimated based on the drawn structure. The volume was 47 cc.  Given the pubic arch appearance and prostate volume, patient remains a good candidate to proceed with prostate seed implant. Today, he freely provided informed written consent to proceed.    PLAN: The patient will undergo prostate seed implant.   ________________________________  Artist Pais. Kathrynn Running, M.D.  This document serves as a record of services personally performed by Margaretmary Dys, MD. It was created on his behalf by Mickie Bail, a trained medical scribe. The creation of this record is based on the scribe's personal observations and the provider's statements to them. This document has been checked and approved by the attending provider.

## 2018-02-06 NOTE — Progress Notes (Signed)
Introduced myself to [name] as the prostate nurse navigator and my role. I was unable to meet him 8/14 when he consulted with Dr.Manning. He has chosen brachytherapy as treatment. He is scheduled for 03/11/18. I asked him to call me with questions or concerns.

## 2018-02-09 ENCOUNTER — Other Ambulatory Visit (HOSPITAL_COMMUNITY): Payer: 59

## 2018-02-11 ENCOUNTER — Other Ambulatory Visit: Payer: Self-pay | Admitting: Urology

## 2018-02-11 DIAGNOSIS — C61 Malignant neoplasm of prostate: Secondary | ICD-10-CM

## 2018-02-17 DIAGNOSIS — Z85828 Personal history of other malignant neoplasm of skin: Secondary | ICD-10-CM | POA: Diagnosis not present

## 2018-02-17 DIAGNOSIS — L821 Other seborrheic keratosis: Secondary | ICD-10-CM | POA: Diagnosis not present

## 2018-02-24 DIAGNOSIS — C61 Malignant neoplasm of prostate: Secondary | ICD-10-CM | POA: Diagnosis not present

## 2018-03-03 ENCOUNTER — Telehealth: Payer: Self-pay | Admitting: *Deleted

## 2018-03-03 NOTE — Telephone Encounter (Signed)
Called patient to remind of labs for 03-04-18 - arrival time- 1:45 pm @ [facility] Admitting,lvm for a return call

## 2018-03-04 ENCOUNTER — Inpatient Hospital Stay (HOSPITAL_COMMUNITY)
Admission: RE | Admit: 2018-03-04 | Discharge: 2018-03-04 | Disposition: A | Discharge status: Home / Self Care | Payer: 59 | Source: Ambulatory Visit

## 2018-03-05 ENCOUNTER — Telehealth: Payer: Self-pay | Admitting: *Deleted

## 2018-03-05 NOTE — Pre-Procedure Instructions (Signed)
Left voicemail with Enid Derry that [name] missed his lab appointment 03/04/2018.  I have left a message with [name] to reschedule lab appointment.

## 2018-03-05 NOTE — Telephone Encounter (Signed)
CALLED PATIENT TO ASK QUESTION, LVM FOR A RETURN CALL 

## 2018-03-06 ENCOUNTER — Encounter (HOSPITAL_BASED_OUTPATIENT_CLINIC_OR_DEPARTMENT_OTHER): Payer: Self-pay

## 2018-03-10 ENCOUNTER — Telehealth: Payer: Self-pay | Admitting: *Deleted

## 2018-03-10 ENCOUNTER — Encounter (HOSPITAL_BASED_OUTPATIENT_CLINIC_OR_DEPARTMENT_OTHER): Payer: Self-pay | Admitting: Anesthesiology

## 2018-03-10 NOTE — Telephone Encounter (Signed)
CALLED PATIENT TO REMIND OF PROCEDURE FOR 03-11-18, LVM FOR A RETURN CALL

## 2018-03-10 NOTE — Anesthesia Preprocedure Evaluation (Addendum)
Anesthesia Evaluation  Patient identified by MRN, date of birth, ID band Patient awake    Reviewed: Allergy & Precautions, NPO status , Patient's Chart, lab work & pertinent test results  Airway Mallampati: I  TM Distance: >3 FB Neck ROM: Full    Dental no notable dental hx. (+) Teeth Intact, Caps   Pulmonary former smoker,    Pulmonary exam normal breath sounds clear to auscultation       Cardiovascular + DOE  + Valvular Problems/Murmurs AI  Rhythm:Regular Rate:Normal  2 D ECHO 06-28-16 The estimated ejection fraction was in the range of 50% to 55%.Wall motion was normal; there were no regional wall motion (grade 2 diastolic dysfunction).There was mild aortic regurgitation..    Neuro/Psych Benign essential tremor  negative psych ROS   GI/Hepatic Neg liver ROS, Diverticulosis    Endo/Other  Hyperlipidemia  Renal/GU negative Renal ROS   Prostate Ca     Musculoskeletal  (+) Arthritis , Osteoarthritis,  LBP   Abdominal   Peds  Hematology negative hematology ROS (+)   Anesthesia Other Findings   Reproductive/Obstetrics                          Anesthesia Physical Anesthesia Plan  ASA: II  Anesthesia Plan: General   Post-op Pain Management:    Induction: Intravenous  PONV Risk Score and Plan: 4 or greater and Ondansetron, Dexamethasone and Treatment may vary due to age or medical condition  Airway Management Planned: LMA  Additional Equipment:   Intra-op Plan:   Post-operative Plan: Extubation in OR  Informed Consent: I have reviewed the patients History and Physical, chart, labs and discussed the procedure including the risks, benefits and alternatives for the proposed anesthesia with the patient or authorized representative who has indicated his/her understanding and acceptance.   Dental advisory given  Plan Discussed with: CRNA and Surgeon  Anesthesia Plan Comments:         Anesthesia Quick Evaluation

## 2018-03-10 NOTE — Pre-Procedure Instructions (Signed)
Have left several messages on all numbers listed for [name] and his wife no return call.  I spoke with Adam Gaines and they have not returned her calls either.

## 2018-03-11 ENCOUNTER — Ambulatory Visit (HOSPITAL_COMMUNITY): Payer: 59

## 2018-03-11 ENCOUNTER — Encounter (HOSPITAL_BASED_OUTPATIENT_CLINIC_OR_DEPARTMENT_OTHER): Payer: Self-pay | Admitting: Anesthesiology

## 2018-03-11 ENCOUNTER — Ambulatory Visit (HOSPITAL_BASED_OUTPATIENT_CLINIC_OR_DEPARTMENT_OTHER): Payer: 59 | Admitting: Anesthesiology

## 2018-03-11 ENCOUNTER — Ambulatory Visit (HOSPITAL_BASED_OUTPATIENT_CLINIC_OR_DEPARTMENT_OTHER)
Admission: RE | Admit: 2018-03-11 | Discharge: 2018-03-11 | Disposition: A | Discharge status: Home / Self Care | Payer: 59 | Source: Ambulatory Visit | Attending: Urology | Admitting: Urology

## 2018-03-11 ENCOUNTER — Encounter (HOSPITAL_BASED_OUTPATIENT_CLINIC_OR_DEPARTMENT_OTHER)
Admission: RE | Disposition: A | Discharge status: Home / Self Care | Payer: Self-pay | Source: Ambulatory Visit | Attending: Urology

## 2018-03-11 DIAGNOSIS — N138 Other obstructive and reflux uropathy: Secondary | ICD-10-CM | POA: Diagnosis not present

## 2018-03-11 DIAGNOSIS — C61 Malignant neoplasm of prostate: Secondary | ICD-10-CM | POA: Insufficient documentation

## 2018-03-11 DIAGNOSIS — R3912 Poor urinary stream: Secondary | ICD-10-CM | POA: Insufficient documentation

## 2018-03-11 DIAGNOSIS — N5201 Erectile dysfunction due to arterial insufficiency: Secondary | ICD-10-CM | POA: Insufficient documentation

## 2018-03-11 DIAGNOSIS — R351 Nocturia: Secondary | ICD-10-CM | POA: Diagnosis not present

## 2018-03-11 DIAGNOSIS — I351 Nonrheumatic aortic (valve) insufficiency: Secondary | ICD-10-CM | POA: Insufficient documentation

## 2018-03-11 DIAGNOSIS — G250 Essential tremor: Secondary | ICD-10-CM | POA: Diagnosis not present

## 2018-03-11 DIAGNOSIS — N401 Enlarged prostate with lower urinary tract symptoms: Secondary | ICD-10-CM | POA: Insufficient documentation

## 2018-03-11 DIAGNOSIS — I5189 Other ill-defined heart diseases: Secondary | ICD-10-CM | POA: Diagnosis not present

## 2018-03-11 DIAGNOSIS — M1990 Unspecified osteoarthritis, unspecified site: Secondary | ICD-10-CM | POA: Diagnosis not present

## 2018-03-11 DIAGNOSIS — E785 Hyperlipidemia, unspecified: Secondary | ICD-10-CM | POA: Insufficient documentation

## 2018-03-11 DIAGNOSIS — Z87891 Personal history of nicotine dependence: Secondary | ICD-10-CM | POA: Insufficient documentation

## 2018-03-11 DIAGNOSIS — Z79899 Other long term (current) drug therapy: Secondary | ICD-10-CM | POA: Diagnosis not present

## 2018-03-11 DIAGNOSIS — Z8249 Family history of ischemic heart disease and other diseases of the circulatory system: Secondary | ICD-10-CM | POA: Insufficient documentation

## 2018-03-11 HISTORY — DX: Other hemorrhoids: K64.8

## 2018-03-11 HISTORY — DX: Dyspnea, unspecified: R06.00

## 2018-03-11 HISTORY — DX: Cardiomegaly: I51.7

## 2018-03-11 HISTORY — DX: Spondylosis without myelopathy or radiculopathy, cervical region: M47.812

## 2018-03-11 HISTORY — DX: Heart disease, unspecified: I51.9

## 2018-03-11 HISTORY — DX: Nonrheumatic aortic (valve) insufficiency: I35.1

## 2018-03-11 HISTORY — DX: Other seasonal allergic rhinitis: J30.2

## 2018-03-11 HISTORY — DX: Diverticulosis of large intestine without perforation or abscess without bleeding: K57.30

## 2018-03-11 HISTORY — DX: Other forms of dyspnea: R06.09

## 2018-03-11 HISTORY — PX: RADIOACTIVE SEED IMPLANT: SHX5150

## 2018-03-11 HISTORY — DX: Deficiency of other specified B group vitamins: E53.8

## 2018-03-11 HISTORY — DX: Unspecified cataract: H26.9

## 2018-03-11 HISTORY — DX: Vitamin D deficiency, unspecified: E55.9

## 2018-03-11 HISTORY — PX: SPACE OAR INSTILLATION: SHX6769

## 2018-03-11 LAB — CBC
HCT: 40.6 % (ref 39.0–52.0)
HEMOGLOBIN: 13.5 g/dL (ref 13.0–17.0)
MCH: 31.5 pg (ref 26.0–34.0)
MCHC: 33.3 g/dL (ref 30.0–36.0)
MCV: 94.9 fL (ref 80.0–100.0)
Platelets: 160 10*3/uL (ref 150–400)
RBC: 4.28 MIL/uL (ref 4.22–5.81)
RDW: 12.9 % (ref 11.5–15.5)
WBC: 4.2 10*3/uL (ref 4.0–10.5)
nRBC: 0 % (ref 0.0–0.2)

## 2018-03-11 LAB — COMPREHENSIVE METABOLIC PANEL
ALK PHOS: 32 U/L — AB (ref 38–126)
ALT: 35 U/L (ref 0–44)
ANION GAP: 7 (ref 5–15)
AST: 39 U/L (ref 15–41)
Albumin: 3.8 g/dL (ref 3.5–5.0)
BILIRUBIN TOTAL: 0.8 mg/dL (ref 0.3–1.2)
BUN: 20 mg/dL (ref 8–23)
CALCIUM: 9.3 mg/dL (ref 8.9–10.3)
CO2: 28 mmol/L (ref 22–32)
Chloride: 106 mmol/L (ref 98–111)
Creatinine, Ser: 0.78 mg/dL (ref 0.61–1.24)
GFR calc Af Amer: >60 mL/min (ref >60)
GFR calc non Af Amer: >60 mL/min (ref >60)
Glucose, Bld: 113 mg/dL — ABNORMAL HIGH (ref 70–99)
Potassium: 4.4 mmol/L (ref 3.5–5.1)
SODIUM: 141 mmol/L (ref 135–145)
Total Protein: 5.9 g/dL — ABNORMAL LOW (ref 6.5–8.1)

## 2018-03-11 LAB — PROTIME-INR
INR: 0.96
PROTHROMBIN TIME: 12.7 s (ref 11.4–15.2)

## 2018-03-11 LAB — APTT: aPTT: 24 seconds (ref 24–36)

## 2018-03-11 SURGERY — INSERTION, RADIATION SOURCE, PROSTATE
Anesthesia: General | Site: Prostate

## 2018-03-11 MED ORDER — LIDOCAINE HCL (CARDIAC) PF 100 MG/5ML IV SOSY
PREFILLED_SYRINGE | INTRAVENOUS | Status: AC
  Filled 2018-03-11: qty 5

## 2018-03-11 MED ORDER — PROPOFOL 10 MG/ML IV BOLUS
INTRAVENOUS | Status: DC | PRN
  Administered 2018-03-11: 120 mg via INTRAVENOUS

## 2018-03-11 MED ORDER — FLEET ENEMA 7-19 GM/118ML RE ENEM
1.0000 | ENEMA | Freq: Once | RECTAL | Status: DC
  Filled 2018-03-11: qty 1

## 2018-03-11 MED ORDER — TRAMADOL HCL 50 MG PO TABS: 50.0000 mg | ORAL_TABLET | Freq: Four times a day (QID) | ORAL | 0 refills | Status: DC | PRN

## 2018-03-11 MED ORDER — KETOROLAC TROMETHAMINE 30 MG/ML IJ SOLN
INTRAMUSCULAR | Status: AC
  Filled 2018-03-11: qty 1

## 2018-03-11 MED ORDER — FENTANYL CITRATE (PF) 100 MCG/2ML IJ SOLN
INTRAMUSCULAR | Status: DC | PRN
  Administered 2018-03-11: 50 ug via INTRAVENOUS

## 2018-03-11 MED ORDER — IOHEXOL 300 MG/ML  SOLN
INTRAMUSCULAR | Status: DC | PRN
  Administered 2018-03-11: 7 mL

## 2018-03-11 MED ORDER — CIPROFLOXACIN IN D5W 400 MG/200ML IV SOLN
INTRAVENOUS | Status: AC
  Filled 2018-03-11: qty 200

## 2018-03-11 MED ORDER — EPHEDRINE SULFATE-NACL 50-0.9 MG/10ML-% IV SOSY
PREFILLED_SYRINGE | INTRAVENOUS | Status: DC | PRN
  Administered 2018-03-11: 15 mg via INTRAVENOUS

## 2018-03-11 MED ORDER — ONDANSETRON HCL 4 MG/2ML IJ SOLN
INTRAMUSCULAR | Status: AC
  Filled 2018-03-11: qty 2

## 2018-03-11 MED ORDER — WHITE PETROLATUM EX OINT
TOPICAL_OINTMENT | CUTANEOUS | Status: AC
  Filled 2018-03-11: qty 5

## 2018-03-11 MED ORDER — PROPOFOL 10 MG/ML IV BOLUS
INTRAVENOUS | Status: AC
  Filled 2018-03-11: qty 40

## 2018-03-11 MED ORDER — DEXAMETHASONE SODIUM PHOSPHATE 10 MG/ML IJ SOLN
INTRAMUSCULAR | Status: AC
  Filled 2018-03-11: qty 1

## 2018-03-11 MED ORDER — MIDAZOLAM HCL 2 MG/2ML IJ SOLN
INTRAMUSCULAR | Status: AC
  Filled 2018-03-11: qty 2

## 2018-03-11 MED ORDER — SODIUM CHLORIDE 0.9 % IJ SOLN
INTRAMUSCULAR | Status: DC | PRN
  Administered 2018-03-11: 10 mL

## 2018-03-11 MED ORDER — MIDAZOLAM HCL 2 MG/2ML IJ SOLN
INTRAMUSCULAR | Status: DC | PRN
  Administered 2018-03-11: 1 mg via INTRAVENOUS

## 2018-03-11 MED ORDER — EPHEDRINE 5 MG/ML INJ
INTRAVENOUS | Status: AC
  Filled 2018-03-11: qty 10

## 2018-03-11 MED ORDER — SULFAMETHOXAZOLE-TRIMETHOPRIM 800-160 MG PO TABS: 1.0000 | ORAL_TABLET | Freq: Two times a day (BID) | ORAL | 0 refills | Status: DC

## 2018-03-11 MED ORDER — LACTATED RINGERS IV SOLN
INTRAVENOUS | Status: DC
  Administered 2018-03-11: 07:00:00 via INTRAVENOUS
  Filled 2018-03-11: qty 1000

## 2018-03-11 MED ORDER — PHENAZOPYRIDINE HCL 200 MG PO TABS: 200.0000 mg | ORAL_TABLET | Freq: Three times a day (TID) | ORAL | 0 refills | Status: DC | PRN

## 2018-03-11 MED ORDER — LACTATED RINGERS IV SOLN
INTRAVENOUS | Status: DC | PRN
  Administered 2018-03-11 (×2): via INTRAVENOUS

## 2018-03-11 MED ORDER — FENTANYL CITRATE (PF) 100 MCG/2ML IJ SOLN
INTRAMUSCULAR | Status: AC
  Filled 2018-03-11: qty 2

## 2018-03-11 MED ORDER — LIDOCAINE 2% (20 MG/ML) 5 ML SYRINGE
INTRAMUSCULAR | Status: DC | PRN
  Administered 2018-03-11: 80 mg via INTRAVENOUS

## 2018-03-11 MED ORDER — SODIUM CHLORIDE 0.9 % IV SOLN
INTRAVENOUS | Status: AC | PRN
  Administered 2018-03-11: 500 mL

## 2018-03-11 MED ORDER — CIPROFLOXACIN IN D5W 400 MG/200ML IV SOLN
400.0000 mg | INTRAVENOUS | Status: AC
  Administered 2018-03-11: 400 mg via INTRAVENOUS
  Filled 2018-03-11: qty 200

## 2018-03-11 MED ORDER — KETOROLAC TROMETHAMINE 30 MG/ML IJ SOLN
INTRAMUSCULAR | Status: DC | PRN
  Administered 2018-03-11: 30 mg via INTRAVENOUS

## 2018-03-11 MED ORDER — DEXAMETHASONE SODIUM PHOSPHATE 10 MG/ML IJ SOLN
INTRAMUSCULAR | Status: DC | PRN
  Administered 2018-03-11: 5 mg via INTRAVENOUS

## 2018-03-11 MED ORDER — ONDANSETRON HCL 4 MG/2ML IJ SOLN
INTRAMUSCULAR | Status: DC | PRN
  Administered 2018-03-11: 4 mg via INTRAVENOUS

## 2018-03-11 SURGICAL SUPPLY — 41 items
BAG URINE DRAINAGE (UROLOGICAL SUPPLIES) ×2 IMPLANT
BLADE CLIPPER SURG (BLADE) ×2 IMPLANT
CATH FOLEY 2WAY SLVR  5CC 16FR (CATHETERS) ×1
CATH FOLEY 2WAY SLVR 5CC 16FR (CATHETERS) ×1 IMPLANT
CATH ROBINSON RED A/P 16FR (CATHETERS) IMPLANT
CATH ROBINSON RED A/P 20FR (CATHETERS) ×2 IMPLANT
CLOTH BEACON ORANGE TIMEOUT ST (SAFETY) ×2 IMPLANT
CONT SPECI 4OZ STER CLIK (MISCELLANEOUS) ×4 IMPLANT
COVER BACK TABLE 60X90IN (DRAPES) ×2 IMPLANT
COVER MAYO STAND STRL (DRAPES) ×2 IMPLANT
DRSG TEGADERM 4X4.75 (GAUZE/BANDAGES/DRESSINGS) ×2 IMPLANT
DRSG TEGADERM 8X12 (GAUZE/BANDAGES/DRESSINGS) ×2 IMPLANT
GAUZE SPONGE 4X4 12PLY STRL (GAUZE/BANDAGES/DRESSINGS) ×2 IMPLANT
GLOVE BIO SURGEON STRL SZ 6 (GLOVE) IMPLANT
GLOVE BIO SURGEON STRL SZ 6.5 (GLOVE) IMPLANT
GLOVE BIO SURGEON STRL SZ7 (GLOVE) IMPLANT
GLOVE BIO SURGEON STRL SZ7.5 (GLOVE) ×4 IMPLANT
GLOVE BIO SURGEON STRL SZ8 (GLOVE) IMPLANT
GLOVE BIOGEL PI IND STRL 6 (GLOVE) IMPLANT
GLOVE BIOGEL PI IND STRL 6.5 (GLOVE) IMPLANT
GLOVE BIOGEL PI IND STRL 8 (GLOVE) IMPLANT
GLOVE BIOGEL PI INDICATOR 6 (GLOVE)
GLOVE BIOGEL PI INDICATOR 6.5 (GLOVE)
GLOVE BIOGEL PI INDICATOR 8 (GLOVE)
GLOVE ECLIPSE 8.0 STRL XLNG CF (GLOVE) IMPLANT
GLOVE INDICATOR 7.0 STRL GRN (GLOVE) IMPLANT
GOWN STRL REUS W/TWL XL LVL3 (GOWN DISPOSABLE) ×2 IMPLANT
HOLDER FOLEY CATH W/STRAP (MISCELLANEOUS) ×2 IMPLANT
I seed AGX100 ×158 IMPLANT
IMPL SPACEOAR SYSTEM 10ML (Spacer) ×1 IMPLANT
IMPLANT SPACEOAR SYSTEM 10ML (Spacer) ×2 IMPLANT
IV NS 1000ML (IV SOLUTION) ×4
IV NS 1000ML BAXH (IV SOLUTION) ×2 IMPLANT
KIT TURNOVER CYSTO (KITS) ×2 IMPLANT
MARKER SKIN DUAL TIP RULER LAB (MISCELLANEOUS) ×2 IMPLANT
PACK CYSTO (CUSTOM PROCEDURE TRAY) ×2 IMPLANT
SUT BONE WAX W31G (SUTURE) IMPLANT
SYR 10ML LL (SYRINGE) ×2 IMPLANT
UNDERPAD 30X30 (UNDERPADS AND DIAPERS) ×4 IMPLANT
WATER STERILE IRR 3000ML UROMA (IV SOLUTION) IMPLANT
WATER STERILE IRR 500ML POUR (IV SOLUTION) ×2 IMPLANT

## 2018-03-11 NOTE — Op Note (Signed)
Preoperative diagnosis: Clinical stage TI C adenocarcinoma the prostate  Postoperative diagnosis: Same  Procedure:  #1 I-125 prostate seed implantation with Nucletron robotic implanter  #2 cystourethroscopy #3 instillation of SpaceOAR biogel  Surgeon: Louis Meckel, M.D. Radiation Oncologist: Tyler Pita, M.D.  Anesthesia: Gen.   Indications: Patient  was diagnosed with clinical stage TI C prostate cancer. We had extensive discussion with him about treatment options versus. He elected to proceed with seed implantation. He underwent consultation my office as well as with Dr. Tyler Pita. He appeared to understand the advantages disadvantages potential risks of this treatment option. Full informed consent has been obtained. The patient is had preoperative ciprofloxacin. PAS compression boots were placed.   Technique and findings: Patient was brought the operating room where he had  successful induction of general anesthesia. He was placed in lithotomy position and prepped and draped in usual manner. Appropriate surgical timeout was performed. Radiation oncology department placed a transrectal ultrasound probe anchoring stand. Foley catheter with contrast in the balloon was inserted without difficulty. Anchoring needles were placed within the prostate. Real-time contouring of the urethra prostate and rectum were performed and the dosing parameters were established. Targeted dose was 145 gray. We then came to the operating suite suite for placement of the needles. A second timeout was performed. All needle passage was done with real-time transrectal ultrasound guidance with the sagittal plane. A total of 28 needles were placed. See implantation itself was done with the robotic implanter. 79 active seeds were implanted. The brachytherapy template was then removed.   A site in the midline was selected on the perineum for placement of an 18 g needle with saline.  The needle was advanced above the  rectum and below Denonvillier's fascia to the mid gland and confirmed to be in the midline on transverse imaging.  One cc of saline was injected confirming appropriate expansion of this space.  A total of 5 cc of saline was then injected to open the space further bilaterally.  The saline syringe was then removed and the SpaceOAR hydrogel was injected with good distribution bilaterally.A Foley catheter was removed and flexible cystoscopy failed to show any seeds outside the prostate.  The patient was brought to recovery room in stable condition.

## 2018-03-11 NOTE — Anesthesia Postprocedure Evaluation (Signed)
Anesthesia Post Note  Patient: Adam Gaines  Procedure(s) Performed: RADIOACTIVE SEED IMPLANT/BRACHYTHERAPY IMPLANT (N/A Prostate) SPACE OAR INSTILLATION (N/A Prostate)     Anesthesia Post Evaluation  Last Vitals:  Vitals:   03/11/18 1030 03/11/18 1045  BP: 131/81 138/79  Pulse: (!) 58 66  Resp: 15 (!) 28  Temp:    SpO2: 100% 97%    Last Pain:  Vitals:   03/11/18 1045  TempSrc:   PainSc: 0-No pain                 Ryver Poblete F

## 2018-03-11 NOTE — H&P (Signed)
Pt presents today for pre-operative history and physical exam in anticipation of brachytherapy and space oar placement by Dr. Louis Meckel on 03/11/18. He is doing well and without complaint. He is looking forward to going to see his granddaughter in [place]. Pt denies F/C, HA, CP, SOB, N/V, diarrhea/constipation, back pain, flank pain, hematuria, and dysuria.    HX:     CC: f/u for Prostate Cancer  HPI: Adam Gaines is a 72 year-old male established patient who is here for f/u while on Active Surveillance for Prostate Cancer .  Presents today for f/u and discussion of his prostate cancer. He has recovered well from the biopsy. His path was sent for second opinion to [facility] and 3+4=7 was confirmed.   He is here today with his wife. He is otherwise very healthy.   The patient was last seen 2 weeks.   He was diagnosed with prostate cancer in approximately 12/10/2017. At the time of his prostate cancer diagnosis his PSA was 7.0. The patient's Gleason score at the time of diagnosis was 3+4=7. There were 2 positive cores on his initial biospy. He has had 1 biopsy(ies). The patient's most recent biopsy was approximately 12/03/2017.   The patient has had a prostate MRI. This showed: PIRADS 4 left apex.   PSA History: 4/19: 6.29, 4/18: 4.16, 4/17: 4.41, 4/16: 3.14, 10/15: 3.95, 10/14: 2.37.   He does not have problems with erections. He does not have urinary incontinence. He does not have an abnormal sensation when he needs to urinate. He does not have to strain or bear down to start his urinary stream.   He is not having pain in new locations. He does not have a good appetite. His bowels are moving normally. He has not seen blood in his stool since the biopsy. He has not recently had unwanted weight loss.     ALLERGIES: No Allergies    MEDICATIONS: Tamsulosin Hcl 0.4 mg capsule 1 capsule PO Daily  Lipitor 20 mg tablet Oral  Multiple Vitamins  Prednisone 10 mg tablet Oral  Tumeric 1 PO Daily      Notes: prednisone is prn for back issues   GU PSH: Prostate Needle Biopsy - 12/02/2017      PSH Notes: tonsillectomy  nose repair     NON-GU PSH: Back Surgery (Unspecified) Cataract surgery, Bilateral Surgical Pathology, Gross And Microscopic Examination For Prostate Needle - 12/02/2017    GU PMH: Prostate Cancer - 12/17/2017 Elevated PSA - 11/10/2017, - 09/26/2017, - 09/23/2016, Elevated prostate specific antigen (PSA), - 2017 Weak Urinary Stream - 11/10/2017 ED due to arterial insufficiency - 09/26/2017 BPH w/LUTS - 09/23/2016, Benign localized hyperplasia of prostate with urinary obstruction, - 2017 Nocturia, Nocturia - 2017      PMH Notes:  2013-03-29 09:22:14 - Note: No significant past medical history   NON-GU PMH: Encounter for general adult medical examination without abnormal findings, Encounter for preventive health examination - 2017    FAMILY HISTORY: Acute Myocardial Infarction - Mother, Father Deceased - Mother, Father Hematuria - Runs In Family Kidney Stones - Runs In Family   SOCIAL HISTORY: Marital Status: Married Preferred Language: English; Ethnicity: Not Hispanic Or Latino; Race: White Current Smoking Status: Patient does not smoke anymore. Smoked for 10 years.   Tobacco Use Assessment Completed: Used Tobacco in last 30 days? Does not use smokeless tobacco. Drinks 3 drinks per week.  Does not use drugs. Drinks 2 caffeinated drinks per day. Has not had a blood transfusion. Patient's occupation Radiation protection practitioner.  Notes: 1-2 drinks 3 days per week wine and scotch   REVIEW OF SYSTEMS:    GU Review Male:   Patient denies frequent urination, hard to postpone urination, burning/ pain with urination, get up at night to urinate, leakage of urine, stream starts and stops, trouble starting your stream, have to strain to urinate , erection problems, and penile pain.  Gastrointestinal (Upper):   Patient denies nausea, vomiting, and indigestion/ heartburn.   Gastrointestinal (Lower):   Patient denies diarrhea and constipation.  Constitutional:   Patient denies fever, night sweats, weight loss, and fatigue.  Skin:   Patient denies skin rash/ lesion and itching.  Eyes:   Patient denies blurred vision and double vision.  Ears/ Nose/ Throat:   Patient denies sore throat and sinus problems.  Hematologic/Lymphatic:   Patient denies swollen glands and easy bruising.  Cardiovascular:   Patient denies leg swelling and chest pains.  Respiratory:   Patient denies cough and shortness of breath.  Endocrine:   Patient denies excessive thirst.  Musculoskeletal:   Patient reports back pain. Patient denies joint pain.  Neurological:   Patient denies headaches and dizziness.  Psychologic:   Patient denies depression and anxiety.   VITAL SIGNS:      02/24/2018 02:09 PM  Weight 152 lb / 68.95 kg  Height 67 in / 170.18 cm  BP 115/73 mmHg  Pulse 55 /min  Temperature 98.3 F / 36.8 C  BMI 23.8 kg/m   MULTI-SYSTEM PHYSICAL EXAMINATION:    Constitutional: Well-nourished. No physical deformities. Normally developed. Good grooming.  Neck: Neck symmetrical, not swollen. Normal tracheal position.  Respiratory: Normal breath sounds. No labored breathing, no use of accessory muscles.   Cardiovascular: Regular rate and rhythm. No murmur, no gallop. Normal temperature, normal extremity pulses, no swelling, no varicosities.   Lymphatic: No enlargement of neck, axillae, groin.  Skin: No paleness, no jaundice, no cyanosis. No lesion, no ulcer, no rash.  Neurologic / Psychiatric: Oriented to time, oriented to place, oriented to person. No depression, no anxiety, no agitation.  Gastrointestinal: No mass, no tenderness, no rigidity, non obese abdomen.  Eyes: Normal conjunctivae. Normal eyelids.  Ears, Nose, Mouth, and Throat: Left ear no scars, no lesions, no masses. Right ear no scars, no lesions, no masses. Nose no scars, no lesions, no masses. Normal hearing. Normal lips.   Musculoskeletal: Normal gait and station of head and neck.     PAST DATA REVIEWED:  Source Of History:  Patient  Records Review:   Previous Patient Records  Urine Test Review:   Urinalysis   10/31/17  PSA  Total PSA 7.00 ng/mL    02/24/18  Urinalysis  Urine Appearance Clear   Urine Color Yellow   Urine Glucose Neg mg/dL  Urine Bilirubin Neg mg/dL  Urine Ketones Neg mg/dL  Urine Specific Gravity 1.025   Urine Blood Neg ery/uL  Urine pH 5.5   Urine Protein Trace mg/dL  Urine Urobilinogen 0.2 mg/dL  Urine Nitrites Neg   Urine Leukocyte Esterase Neg leu/uL   PROCEDURES: None   ASSESSMENT:      ICD-10 Details  1 GU:   Prostate Cancer - C61    PLAN:           Schedule Return Visit/Planned Activity: Keep Scheduled Appointment - Schedule Surgery          Document Letter(s):  Created for Patient: Clinical Summary         Notes:   There are no changes in the patients  history or physical exam since last evaluation by Dr. Louis Meckel. Pt is scheduled to undergo brachytherapy and space oar placement on 03/11/18.   All pt's questions were answered to the best of my ability.

## 2018-03-11 NOTE — Discharge Instructions (Signed)
DISCHARGE INSTRUCTIONS FOR PROSTATE SEED IMPLANTATION  Antibiotics You may be given a prescription for an antibiotic to take when you arrive home. If so, be sure to take every tablet in the bottle, even if you are feeling better before the prescription is finished. If you begin itching, notice a rash or start to swell on your trunk, arms, legs and/or throat, immediately stop taking the antibiotic and call your Urologist.  Diet Resume your usual diet when you return home. To keep your bowels moving easily and softly, drink prune, apple and cranberry juice at room temperature. You may also take a stool softener, such as Colace, which is available without prescription at local pharmacies.  Daily activities ? No driving or heavy lifting for at least two days after the implant. ? No bike riding, horseback riding or riding lawn mowers for the first month after the implant. ? Any strenuous physical activity should be approved by your doctor before you resume it.  Sexual relations You may resume sexual relations two weeks after the procedure. A condom should be used for the first two weeks. Your semen may be dark brown or black; this is normal and is related bleeding that may have occurred during the implant.  Postoperative swelling Expect swelling and bruising of the scrotum and perineum (the area between the scrotum and anus). Both the swelling and the bruising should resolve in l or 2 weeks. Ice packs and over- the-counter medications such as Tylenol, Advil or Aleve may lessen your discomfort.  Postoperative urination Most men experience burning on urination and/or urinary frequency. If this becomes bothersome, contact your Urologist.  Medication can be prescribed to relieve these problems.  It is normal to have some blood in your urine for a few days after the implant.  Special instructions related to the seeds It is unlikely that you will pass an Iodine-125 seed in your urine. The seeds are  silver in color and are about as large as a grain of rice. If you pass a seed, do not handle it with your fingers. Use a spoon to place it in an envelope or jar in place this in base occluded area such as the garage or basement for return to the radiation clinic at your convenience.  Contact your doctor for ? Temperature greater than 101 F ? Increasing pain ? Inability to urinate  Follow-up  You should have follow up with your urologist and radiation oncologist about 3 weeks after the procedure.  General information regarding Iodine seeds ? Iodine-125 is a low energy radioactive material. It is not deeply penetrating and loses energy at short distances. Your prostate will absorb the radiation. Objects that are touched or used by the patient do not become radioactive. ? Body wastes (urine and stool) or body fluids (saliva, tears, semen or blood) are not radioactive. ? The Nuclear Regulatory Commission [facility]) has determined that no radiation precautions are needed for patients undergoing Iodine-125 seed implantation. The [facility] states that such patients do not present a risk to the people around them, including young children and pregnant women. However, in keeping with the general principle that radiation exposure should be kept as low reasonably possible, we suggest the following: ? Children and pets should not sit on the patient's lap for the first two (2) weeks after the implant. ? Pregnant (or possibly pregnant) women should avoid prolonged, close contact with the patient for the first two (2) weeks after the implant. ? A distance of three (3) feet is  acceptable. ? At a distance of three (3) feet, there is no limit to the length of time anyone can be with the patient.     NO ADVIL, ALEVE, MOTRIN, IBUPROFEN UNTIL 4 PM TODAY    POST ANESTHESIA HOME CARE INSTRUCTIONS  Activity: Get plenty of rest for the remainder of the day. A responsible adult should stay with you for 24 hours following the  procedure.  For the next 24 hours, DO NOT: -Drive a car -Paediatric nurse -Drink alcoholic beverages -Take any medication unless instructed by your physician -Make any legal decisions or sign important papers.  Meals: Start with liquid foods such as gelatin or soup. Progress to regular foods as tolerated. Avoid greasy, spicy, heavy foods. If nausea and/or vomiting occur, drink only clear liquids until the nausea and/or vomiting subsides. Call your physician if vomiting continues.  Special Instructions/Symptoms: Your throat may feel dry or sore from the anesthesia or the breathing tube placed in your throat during surgery. If this causes discomfort, gargle with warm salt water. The discomfort should disappear within 24 hours.  If you had a scopolamine patch placed behind your ear for the management of post- operative nausea and/or vomiting:  1. The medication in the patch is effective for 72 hours, after which it should be removed.  Wrap patch in a tissue and discard in the trash. Wash hands thoroughly with soap and water. 2. You may remove the patch earlier than 72 hours if you experience unpleasant side effects which may include dry mouth, dizziness or visual disturbances. 3. Avoid touching the patch. Wash your hands with soap and water after contact with the patch.

## 2018-03-11 NOTE — Anesthesia Postprocedure Evaluation (Signed)
Anesthesia Post Note  Patient: Adam Gaines  Procedure(s) Performed: RADIOACTIVE SEED IMPLANT/BRACHYTHERAPY IMPLANT (N/A Prostate) SPACE OAR INSTILLATION (N/A Prostate)     Patient location during evaluation: PACU Anesthesia Type: General Level of consciousness: awake and alert and oriented Pain management: pain level controlled Vital Signs Assessment: post-procedure vital signs reviewed and stable Respiratory status: spontaneous breathing, nonlabored ventilation and respiratory function stable Cardiovascular status: blood pressure returned to baseline and stable Postop Assessment: no apparent nausea or vomiting Anesthetic complications: no    Last Vitals:  Vitals:   03/11/18 1015 03/11/18 1030  BP: (!) 143/76 131/81  Pulse: 66 (!) 58  Resp: 18 15  Temp:    SpO2: 100% 100%    Last Pain:  Vitals:   03/11/18 1013  TempSrc:   PainSc: 0-No pain                 Jasmine Maceachern A.

## 2018-03-11 NOTE — Anesthesia Postprocedure Evaluation (Deleted)
Anesthesia Post Note  Patient: Adam Gaines  Procedure(s) Performed: RADIOACTIVE SEED IMPLANT/BRACHYTHERAPY IMPLANT (N/A Prostate) SPACE OAR INSTILLATION (N/A Prostate)     Anesthesia Post Evaluation  Last Vitals:  Vitals:   03/11/18 1030 03/11/18 1045  BP: 131/81 138/79  Pulse: (!) 58 66  Resp: 15 (!) 28  Temp:    SpO2: 100% 97%    Last Pain:  Vitals:   03/11/18 1045  TempSrc:   PainSc: 0-No pain                 Ahmia Colford F

## 2018-03-11 NOTE — Progress Notes (Signed)
Radiation Oncology         [phone]) [phone] ________________________________  Name: JAVIOUS TERRANA MRN: [id number]  Date: 03/11/2018  DOB: 1945/12/21       Prostate Seed Implant  GE:XBMWU, Bernadene Bell, MD  No ref. provider found  DIAGNOSIS: 72 y.o. gentleman with Stage T1c adenocarcinoma of the prostate with Gleason Score of 3+4, and PSA of 19.68    ICD-10-CM   1. Malignant neoplasm of prostate (HCC) C61 Discharge patient  2. Prostate cancer (HCC) C61 CANCELED: DG Chest 2 View    CANCELED: DG Chest 2 View  3. Diastolic dysfunction I51.89     PROCEDURE: Insertion of radioactive I-125 seeds into the prostate gland.  RADIATION DOSE: 145 Gy, definitive therapy.  TECHNIQUE: DARYLE BAILY was brought to the operating room with the urologist. He was placed in the dorsolithotomy position. He was catheterized and a rectal tube was inserted. The perineum was shaved, prepped and draped. The ultrasound probe was then introduced into the rectum to see the prostate gland.  TREATMENT DEVICE: A needle grid was attached to the ultrasound probe stand and anchor needles were placed.  3D PLANNING: The prostate was imaged in 3D using a sagittal sweep of the prostate probe. These images were transferred to the planning computer. There, the prostate, urethra and rectum were defined on each axial reconstructed image. Then, the software created an optimized 3D plan and a few seed positions were adjusted. The quality of the plan was reviewed using [facility] information for the target and the following two organs at risk:  Urethra and Rectum.  Then the accepted plan was printed and handed off to the radiation therapist.  Under my supervision, the custom loading of the seeds and spacers was carried out and loaded into sealed vicryl sleeves.  These pre-loaded needles were then placed into the needle holder.Marland Kitchen  PROSTATE VOLUME STUDY:  Using transrectal ultrasound the volume of the prostate was verified to be 45.6  cc.  SPECIAL TREATMENT PROCEDURE/SUPERVISION AND HANDLING: The pre-loaded needles were then delivered under sagittal guidance. A total of 28 needles were used to deposit 79 seeds in the prostate gland. The individual seed activity was 0.456 mCi.  SpaceOAR:  Yes  COMPLEX SIMULATION: At the end of the procedure, an anterior radiograph of the pelvis was obtained to document seed positioning and count. Cystoscopy was performed to check the urethra and bladder.  MICRODOSIMETRY: At the end of the procedure, the patient was emitting 0.155 mR/hr at 1 meter. Accordingly, he was considered safe for hospital discharge.  PLAN: The patient will return to the radiation oncology clinic for post implant CT dosimetry in three weeks.   ________________________________  Artist Pais Kathrynn Running, M.D.

## 2018-03-11 NOTE — Anesthesia Procedure Notes (Signed)
Procedure Name: LMA Insertion Date/Time: 03/11/2018 8:51 AM Performed by: Wanita Chamberlain, CRNA Pre-anesthesia Checklist: Patient identified, Timeout performed, Emergency Drugs available, Patient being monitored and Suction available Patient Re-evaluated:Patient Re-evaluated prior to induction Oxygen Delivery Method: Circle system utilized Preoxygenation: Pre-oxygenation with 100% oxygen Induction Type: IV induction Ventilation: Mask ventilation without difficulty LMA: LMA inserted LMA Size: 4.0 Number of attempts: 1 Placement Confirmation: CO2 detector,  positive ETCO2 and breath sounds checked- equal and bilateral Tube secured with: Tape Dental Injury: Teeth and Oropharynx as per pre-operative assessment

## 2018-03-11 NOTE — Transfer of Care (Addendum)
Immediate Anesthesia Transfer of Care Note  Patient: Adam Gaines  Procedure(s) Performed: RADIOACTIVE SEED IMPLANT/BRACHYTHERAPY IMPLANT (N/A Prostate) SPACE OAR INSTILLATION (N/A Prostate)  Patient Location: PACU  Anesthesia Type:General  Level of Consciousness: awake, alert , oriented and patient cooperative  Airway & Oxygen Therapy: Patient Spontanous Breathing and Patient connected to nasal cannula oxygen  Post-op Assessment: Report given to RN and Post -op Vital signs reviewed and stable  Post vital signs: Reviewed and stable  Last Vitals:  Vitals Value Taken Time  BP 138/79 03/11/2018 10:45 AM  Temp    Pulse 64 03/11/2018 10:51 AM  Resp 19 03/11/2018 10:51 AM  SpO2 100 % 03/11/2018 10:51 AM  Vitals shown include unvalidated device data.  Last Pain:  Vitals:   03/11/18 1045  TempSrc:   PainSc: 0-No pain      Patients Stated Pain Goal: 7 (33/29/51 [id number])  Complications: No apparent anesthesia complications

## 2018-03-11 NOTE — Anesthesia Postprocedure Evaluation (Signed)
Anesthesia Post Note  Patient: Adam Gaines  Procedure(s) Performed: RADIOACTIVE SEED IMPLANT/BRACHYTHERAPY IMPLANT (N/A Prostate) SPACE OAR INSTILLATION (N/A Prostate)     Anesthesia Post Evaluation  Last Vitals:  Vitals:   03/11/18 1030 03/11/18 1045  BP: 131/81 138/79  Pulse: (!) 58 66  Resp: 15 (!) 28  Temp:    SpO2: 100% 97%    Last Pain:  Vitals:   03/11/18 1045  TempSrc:   PainSc: 0-No pain                 Adilson Grafton F

## 2018-03-11 NOTE — Anesthesia Postprocedure Evaluation (Signed)
Anesthesia Post Note  Patient: Adam Gaines  Procedure(s) Performed: RADIOACTIVE SEED IMPLANT/BRACHYTHERAPY IMPLANT (N/A Prostate) SPACE OAR INSTILLATION (N/A Prostate)     Anesthesia Post Evaluation  Last Vitals:  Vitals:   03/11/18 1030 03/11/18 1045  BP: 131/81 138/79  Pulse: (!) 58 66  Resp: 15 (!) 28  Temp:    SpO2: 100% 97%    Last Pain:  Vitals:   03/11/18 1045  TempSrc:   PainSc: 0-No pain                 [name] F

## 2018-03-12 ENCOUNTER — Encounter (HOSPITAL_BASED_OUTPATIENT_CLINIC_OR_DEPARTMENT_OTHER): Payer: Self-pay | Admitting: Urology

## 2018-03-13 ENCOUNTER — Telehealth: Payer: Self-pay | Admitting: *Deleted

## 2018-03-13 NOTE — Telephone Encounter (Signed)
CALLED PATIENT TO INFORM OF POST SEED APPTS., SPOKE WITH PATIENT AND HE IS AWARE OF THESE APPTS.

## 2018-03-20 ENCOUNTER — Telehealth: Payer: Self-pay | Admitting: *Deleted

## 2018-03-20 NOTE — Telephone Encounter (Signed)
RETURNED PATIENT'S PHONE CALL, LVM  

## 2018-04-01 ENCOUNTER — Telehealth: Payer: Self-pay | Admitting: *Deleted

## 2018-04-01 NOTE — Telephone Encounter (Signed)
Called patient to remind of post seed appts. and MRI for 04-02-18, spoke with patient and he is aware of these appts.

## 2018-04-02 ENCOUNTER — Ambulatory Visit (HOSPITAL_COMMUNITY)
Admission: RE | Admit: 2018-04-02 | Discharge: 2018-04-02 | Disposition: A | Discharge status: Home / Self Care | Payer: 59 | Source: Ambulatory Visit | Attending: Urology | Admitting: Urology

## 2018-04-02 ENCOUNTER — Ambulatory Visit
Admission: RE | Admit: 2018-04-02 | Discharge: 2018-04-02 | Disposition: A | Discharge status: Home / Self Care | Payer: 59 | Source: Ambulatory Visit | Attending: Radiation Oncology | Admitting: Radiation Oncology

## 2018-04-02 ENCOUNTER — Encounter: Payer: Self-pay | Admitting: Radiation Oncology

## 2018-04-02 ENCOUNTER — Other Ambulatory Visit: Payer: Self-pay

## 2018-04-02 VITALS — BP 134/78 | HR 67 | Temp 97.9°F | Resp 18 | Ht 67.0 in | Wt 152.6 lb

## 2018-04-02 DIAGNOSIS — Z79899 Other long term (current) drug therapy: Secondary | ICD-10-CM | POA: Diagnosis not present

## 2018-04-02 DIAGNOSIS — Z923 Personal history of irradiation: Secondary | ICD-10-CM | POA: Insufficient documentation

## 2018-04-02 DIAGNOSIS — C61 Malignant neoplasm of prostate: Secondary | ICD-10-CM

## 2018-04-02 NOTE — Progress Notes (Signed)
Radiation Oncology         [id number]) [id number] ________________________________  Name: CAESARE ALBAN MRN: [id number]  Date: 04/02/2018  DOB: 01-03-1946  Post-Seed Follow-Up Visit Note  CC: Etta Grandchild, MD  Crist Fat, MD  Diagnosis:   72 y.o. gentleman with Stage T1c adenocarcinoma of the prostate with Gleason Score of 3+4, and PSA of 19.68    ICD-10-CM   1. Malignant neoplasm of prostate (HCC) C61     Interval Since Last Radiation:  3 weeks Insertion of radioactive I-125 seeds into the prostate gland; 145 Gy, definitive therapy.  Narrative:  The patient returns today for routine follow-up.  He is complaining of increased urinary frequency and urinary hesitation symptoms. He filled out a questionnaire regarding urinary function today providing and overall IPSS score of 15 characterizing his symptoms as moderate with increased frequency, intermittency, hesitancy, weak stream and occasional feelings of incomplete emptying.  He reports nocturia x2 which is manageable.  His pre-implant score was 11.  He specifically denies, gross hematuria, straining or incontinence.  He is using MiraLAX and Colace daily to manage his constipation but denies diarrhea, nausea, vomiting or abdominal pain.  He reports a healthy appetite and has lost approximately 10 pounds, intentionally, with a new high-fiber diet.  Overall, he is quite pleased with his progress to date.  He has a scheduled follow-up with Dr. Marlou Porch on 04/08/2018.  ALLERGIES:  has No Known Allergies.  Meds: Current Outpatient Medications  Medication Sig Dispense Refill  . Ascorbic Acid (VITAMIN C PO) Take 1,000 mg by mouth daily.     Marland Kitchen atorvastatin (LIPITOR) 20 MG tablet Take 1 tablet (20 mg total) by mouth daily. 90 tablet 3  . b complex vitamins tablet Take 1 tablet by mouth daily.    . Cholecalciferol (VITAMIN D) 2000 UNITS tablet Take 1 tablet (2,000 Units total) by mouth daily. 30 tablet 11  . FLUAD 0.5 ML SUSY ADM 0.5ML IM UTD   0  . phenazopyridine (PYRIDIUM) 200 MG tablet Take 1 tablet (200 mg total) by mouth 3 (three) times daily as needed for pain. 10 tablet 0  . tamsulosin (FLOMAX) 0.4 MG CAPS capsule Take 0.4 mg by mouth.    . traMADol (ULTRAM) 50 MG tablet Take 1-2 tablets (50-100 mg total) by mouth every 6 (six) hours as needed for moderate pain. 15 tablet 0  . TURMERIC PO Take 1 capsule by mouth daily.    . predniSONE (DELTASONE) 10 MG tablet   2  . sulfamethoxazole-trimethoprim (BACTRIM DS,SEPTRA DS) 800-160 MG tablet Take 1 tablet by mouth 2 (two) times daily. (Patient not taking: Reported on 04/02/2018) 6 tablet 0   No current facility-administered medications for this encounter.     Physical Findings: In general this is a well appearing Caucasian male in no acute distress.  He's alert and oriented x4 and appropriate throughout the examination. Cardiopulmonary assessment is negative for acute distress and he exhibits normal effort.   Lab Findings: Lab Results  Component Value Date   WBC 4.2 03/11/2018   HGB 13.5 03/11/2018   HCT 40.6 03/11/2018   MCV 94.9 03/11/2018   PLT 160 03/11/2018    Radiographic Findings:  Patient underwent CT imaging in our clinic for post implant dosimetry. The CT was reviewed by Dr. Kathrynn Running and appears to demonstrate an adequate distribution of radioactive seeds throughout the prostate gland. There are no seeds in or near the rectum.  He is scheduled for an MRI of the prostate  later today and these images will be fused with his CT images for further evaluation.  We suspect the final radiation plan and dosimetry will show appropriate coverage of the prostate gland.   Impression/Plan: 72 y.o. gentleman with Stage T1c adenocarcinoma of the prostate with Gleason Score of 3+4, and PSA of 19.68. The patient is recovering from the effects of radiation. His urinary symptoms should gradually improve over the next 4-6 months. We talked about this today. He is encouraged by his  improvement already and is otherwise pleased with his outcome. We also talked about long-term follow-up for prostate cancer following seed implant. He understands that ongoing PSA determinations and digital rectal exams will help perform surveillance to rule out disease recurrence. He has a follow up appointment scheduled with Dr. Marlou Porch on April 08, 2018. He understands what to expect with his PSA measures. Patient was also educated today about some of the long-term effects from radiation including a small risk for rectal bleeding and possibly erectile dysfunction. We talked about some of the general management approaches to these potential complications. However, I did encourage the patient to contact our office or return at any point if he has questions or concerns related to his previous radiation and prostate cancer.    Marguarite Arbour, PA-C

## 2018-04-02 NOTE — Progress Notes (Signed)
Radiation Oncology         [phone]) [phone] ________________________________  Name: Adam Gaines MRN: [id number]  Date: 04/02/2018  DOB: [birth year]  COMPLEX SIMULATION NOTE  NARRATIVE:  The patient was brought to the CT Simulation planning suite today following prostate seed implantation approximately one month ago.  Identity was confirmed.  All relevant records and images related to the planned course of therapy were reviewed.  Then, the patient was set-up supine.  CT images were obtained.  The CT images were loaded into the planning software.  Then the prostate and rectum were contoured.  Treatment planning then occurred.  The implanted iodine 125 seeds were identified by the physics staff for projection of radiation distribution  I have requested : 3D Simulation  I have requested a DVH of the following structures: Prostate and rectum.    ________________________________  Artist Pais Kathrynn Running, M.D.  This document serves as a record of services personally performed by Margaretmary Dys, MD. It was created on his behalf by Mickie Bail, a trained medical scribe. The creation of this record is based on the scribe's personal observations and the provider's statements to them. This document has been checked and approved by the attending provider.

## 2018-04-02 NOTE — Progress Notes (Signed)
Weight and vitals stable. Denies pain. Reports urinary frequency. Reports intermittent urine stream. Reports he must sit to void because he is uncertain if he will have a bowel movement simultaneously. Reports taking Miralax and Colace to manage bowels. Reports he recently lost 10 lb dieting. Denies hematuria. Reports mild discomfort with urination but denies hematuria. Denies urinary leakage or incontinence. Denies blood in ejaculate. Post seed IPSS 15. MRI to confirm [employer] placement at 1100 today. Scheduled to follow up with Dr. Louis Meckel on 11/6.  BP 134/78   Pulse 67   Temp 97.9 F (36.6 C)   Resp 18   Ht 5\' 7"  (1.702 m)   Wt 152 lb 9.6 oz (69.2 kg)   SpO2 100%   BMI 23.90 kg/m

## 2018-04-06 NOTE — Addendum Note (Signed)
Encounter addended by: Heywood Footman, RN on: 04/06/2018 9:25 AM  Actions taken: Charge Capture section accepted

## 2018-04-08 DIAGNOSIS — C61 Malignant neoplasm of prostate: Secondary | ICD-10-CM | POA: Diagnosis not present

## 2018-04-20 ENCOUNTER — Encounter: Payer: Self-pay | Admitting: Radiation Oncology

## 2018-04-26 NOTE — Progress Notes (Signed)
Follow-up Outpatient Visit Date: 04/28/2018  Primary Care Provider: Janith Lima, MD [address] [address]  Chief Complaint Patient presents for f/u of HL    HPI:  Adam Gaines is a 72 y.o. year-old male with history of hyperlipidemia and family history of coronary artery disease, who presents for follow-up of hyperlipidemia and exertional dyspnea. He was previously seen by Einar Crow and then [facility].   Echocardiogram showed preserved LVEF with grade 2 diastolic dysfunction and mild aortic regurgitation. Since the pt was last seen in clinic he has been treated with XRT for prostate Ca  He reports his breathing is good   He denies CP    No edema Still exercising regularly  Walks briskly on treadmill  --------------------------------------------------------------------------------------------------  Cardiovascular History & Procedures: Cardiovascular Problems:  Hyperlipidemia and family history of coronary artery disease  Risk Factors:  Hyperlipidemia, male gender, age greater then 15, and family history of coronary artery disease  Cath/PCI:  None  CV Surgery:  None  EP Procedures and Devices:  None  Non-Invasive Evaluation(s):  Echocardiogram (06/28/16): Normal LV size with mild LVH. LVEF 50-55% with normal wall motion. Grade 2 diastolic dysfunction. Mild aortic regurgitation. Normal RV size and function.  Coronary Calcium Score (03/11/12): CCS = 0.  Borderline dilated ascending aorta.  Two low-density lesions in the liver; ? Cysts.  Exercise tolerance test (04/2008): Normal per Dr. Claris Gladden note.  Recent CV Pertinent Labs: Lab Results  Component Value Date   CHOL 194 04/30/2017   CHOL 203 (H) 05/09/2014   HDL 91.20 04/30/2017   HDL 85 05/09/2014   LDLCALC 87 04/30/2017   LDLCALC 97 05/09/2014   TRIG 76.0 04/30/2017   TRIG 106 05/09/2014   CHOLHDL 2 04/30/2017   INR 0.96 03/11/2018   K 4.4 03/11/2018   BUN 20 03/11/2018   CREATININE 0.78 03/11/2018    Past medical and surgical history were reviewed and updated in EPIC.  Current Meds  Medication Sig  . Ascorbic Acid (VITAMIN C PO) Take 1,000 mg by mouth daily.   Marland Kitchen atorvastatin (LIPITOR) 20 MG tablet Take 1 tablet (20 mg total) by mouth daily.  Marland Kitchen b complex vitamins tablet Take 1 tablet by mouth daily.  . Cholecalciferol (VITAMIN D) 2000 UNITS tablet Take 1 tablet (2,000 Units total) by mouth daily.  . predniSONE (DELTASONE) 10 MG tablet Take 10 mg by mouth daily as needed.   . tamsulosin (FLOMAX) 0.4 MG CAPS capsule Take 0.4 mg by mouth.  . TURMERIC PO Take 1 capsule by mouth daily.    Allergies: Patient has no known allergies.  Social History   Socioeconomic History  . Marital status: Married    Spouse name: Not on file  . Number of children: 2  . Years of education: 42  . Highest education level: Not on file  Occupational History  . Occupation: Engineer, petroleum: Warden/ranger    Comment: attorney, business law  Social Needs  . Financial resource strain: Not on file  . Food insecurity:    Worry: Not on file    Inability: Not on file  . Transportation needs:    Medical: Not on file    Non-medical: Not on file  Tobacco Use  . Smoking status: Former Smoker    Years: 10.00    Types: Cigarettes    Last attempt to quit: 11/28/1978    Years since quitting: 39.4  . Smokeless tobacco: Never Used  Substance and Sexual Activity  . Alcohol use: Yes    Alcohol/week: 5.0 standard drinks    Types: 3 Glasses of wine, 2 Standard drinks or equivalent per week    Comment: social  . Drug use: No  . Sexual activity: Yes    Partners: Female  Lifestyle  . Physical activity:    Days per week: Not on file    Minutes per session: Not on file  . Stress: Not on file  Relationships  . Social connections:    Talks on phone: Not on file    Gets together: Not on file    Attends religious service: Not on file    Active member of club or organization:  Not on file    Attends meetings of clubs or organizations: Not on file    Relationship status: Not on file  . Intimate partner violence:    Fear of current or ex partner: No    Emotionally abused: No    Physically abused: No    Forced sexual activity: No  Other Topics Concern  . Not on file  Social History Narrative   [employer], [place], [place] JD '70. [employer] '70-'74. Marriage '67 and  in good health. 2 dtrs - '69, '72; 6 grandchildren. Work - Furniture conservator/restorer.                      Family History  Problem Relation Age of Onset  . Heart attack Mother 56       after aspiration  . Coronary artery disease Mother   . Heart disease Mother   . COPD Mother        smoker  . Coronary artery disease Father   . Heart disease Father   . Hyperlipidemia Father   . Hypertension Father   . Heart attack Father 32  . Diabetes Maternal Grandmother   . Cancer Maternal Grandmother        unknown/lived to age 67  . Other Other        cardiovascular disease  . Diabetes Paternal Grandmother   . Colon cancer Neg Hx     Review of Systems: A 12-system review of systems was performed and was negative except as noted in the HPI.  --------------------------------------------------------------------------------------------------  Physical Exam: BP 116/60   Pulse 65   Ht 5\' 7"  (1.702 m)   Wt 155 lb (70.3 kg)   SpO2 98%   BMI 24.28 kg/m   General:  Well-developed, well-nourished man, seated comfortably in the exam room. HEENT: NCAT    Neck: Supple without lymphadenopathy, thyromegaly, JVD, or HJR. Lungs: Normal work of breathing. Clear to auscultation bilaterally without wheezes or crackles. Heart: Regular rate and rhythm without murmurs, rubs, or gallops. Non-displaced PMI. Abd: Bowel sounds present. Soft, NT/ND without hepatosplenomegaly Ext: No lower extremity edema. Radial, PT, and DP pulses are 2+ bilaterally. Skin: Warm and dry without  rash.  EKG:   Not done today     Lab Results  Component Value Date   WBC 4.2 03/11/2018   HGB 13.5 03/11/2018   HCT 40.6 03/11/2018   MCV 94.9 03/11/2018   PLT 160 03/11/2018    Lab Results  Component Value Date   NA 141 03/11/2018   K 4.4 03/11/2018   CL 106 03/11/2018   CO2 28 03/11/2018   BUN 20 03/11/2018   CREATININE 0.78 03/11/2018   GLUCOSE 113 (H) 03/11/2018   ALT 35  03/11/2018    Lab Results  Component Value Date   CHOL 194 04/30/2017   HDL 91.20 04/30/2017   LDLCALC 87 04/30/2017   TRIG 76.0 04/30/2017   CHOLHDL 2 04/30/2017    --------------------------------------------------------------------------------------------------  ASSESSMENT AND PLAN: Diastolic dysfunction Pt remains very active   Denies SOB   Planning to run in 5K on thanksgiving  Time:  37 min   Encouraged him to stay active  Aortic regurgitation Mild on echo   No murmur on exam   Follow clinically    Hyperlipidemia On statin   Will set up for fasting lipids.  Follow-up: Return to clinic in 1 year.   Dorris Carnes  04/28/2018 8:46 AM

## 2018-04-28 ENCOUNTER — Other Ambulatory Visit: Payer: 59

## 2018-04-28 ENCOUNTER — Ambulatory Visit: Payer: 59 | Admitting: Internal Medicine

## 2018-04-28 ENCOUNTER — Encounter: Payer: Self-pay | Admitting: Internal Medicine

## 2018-04-28 VITALS — BP 116/60 | HR 65 | Ht 67.0 in | Wt 155.0 lb

## 2018-04-28 DIAGNOSIS — E785 Hyperlipidemia, unspecified: Secondary | ICD-10-CM

## 2018-04-28 DIAGNOSIS — I351 Nonrheumatic aortic (valve) insufficiency: Secondary | ICD-10-CM | POA: Diagnosis not present

## 2018-04-28 NOTE — Patient Instructions (Signed)
Medication Instructions:  No changes If you need a refill on your cardiac medications before your next appointment, please call your pharmacy.   Lab work: Fasting lipids - call the day before to schedule appointment. If you have labs (blood work) drawn today and your tests are completely normal, you will receive your results only by: Marland Kitchen MyChart Message (if you have MyChart) OR . A paper copy in the mail If you have any lab test that is abnormal or we need to change your treatment, we will call you to review the results.  Testing/Procedures: none  Follow-Up: At [facility], you and your health needs are our priority.  As part of our continuing mission to provide you with exceptional heart care, we have created designated Provider Care Teams.  These Care Teams include your primary Cardiologist (physician) and Advanced Practice Providers (APPs -  Physician Assistants and Nurse Practitioners) who all work together to provide you with the care you need, when you need it. You will need a follow up appointment in:  12 months.  Please call our office 2 months in advance to schedule this appointment.  You may see Dr. Harrington Challenger or one of the following Advanced Practice Providers on your designated Care Team: Richardson Dopp, PA-C [place], [place] . Daune Perch, NP  Any Other Special Instructions Will Be Listed Below (If Applicable).

## 2018-04-29 NOTE — Progress Notes (Signed)
Radiation Oncology         [phone]) [phone] ________________________________  Name: Adam Gaines MRN: [id number]  Date: 04/20/2018  DOB: Jun 15, 1945  3D Planning Note   Prostate Brachytherapy Post-Implant Dosimetry  Diagnosis: 72 y.o. gentleman with Stage T1c adenocarcinoma of the prostate with Gleason Score of 3+4, and PSA of 19.68  Narrative: On a previous date, Adam Gaines returned following prostate seed implantation for post implant planning. He underwent CT scan complex simulation to delineate the three-dimensional structures of the pelvis and demonstrate the radiation distribution.  Since that time, the seed localization, and complex isodose planning with dose volume histograms have now been completed.  Results:   Prostate Coverage - The dose of radiation delivered to the 90% or more of the prostate gland (D90) was 113.32% of the prescription dose. This exceeds our goal of greater than 90%. Rectal Sparing - The volume of rectal tissue receiving the prescription dose or higher was 0.15 cc. This falls under our thresholds tolerance of 1.0 cc.  Impression: The prostate seed implant appears to show adequate target coverage and appropriate rectal sparing.  Plan:  The patient will continue to follow with urology for ongoing PSA determinations. I would anticipate a high likelihood for local tumor control with minimal risk for rectal morbidity.  ________________________________  Artist Pais Kathrynn Running, M.D.

## 2018-05-05 ENCOUNTER — Other Ambulatory Visit: Payer: 59

## 2018-05-05 DIAGNOSIS — E785 Hyperlipidemia, unspecified: Secondary | ICD-10-CM | POA: Diagnosis not present

## 2018-05-05 LAB — LIPID PANEL
CHOL/HDL RATIO: 1.9 ratio (ref 0.0–5.0)
CHOLESTEROL TOTAL: 192 mg/dL (ref 100–199)
HDL: 101 mg/dL (ref >39)
LDL Calculated: 79 mg/dL (ref 0–99)
TRIGLYCERIDES: 59 mg/dL (ref 0–149)
VLDL Cholesterol Cal: 12 mg/dL (ref 5–40)

## 2018-05-24 ENCOUNTER — Other Ambulatory Visit: Payer: Self-pay | Admitting: Internal Medicine

## 2018-05-24 DIAGNOSIS — I5189 Other ill-defined heart diseases: Secondary | ICD-10-CM

## 2018-05-25 NOTE — Telephone Encounter (Signed)
Please review for refill. Thanks!  

## 2018-06-01 ENCOUNTER — Encounter: Payer: Self-pay | Admitting: Medical Oncology

## 2018-06-30 DIAGNOSIS — C61 Malignant neoplasm of prostate: Secondary | ICD-10-CM | POA: Diagnosis not present

## 2018-06-30 LAB — PSA
PSA: 3.82
PSA: 3.82

## 2018-07-07 DIAGNOSIS — Z8546 Personal history of malignant neoplasm of prostate: Secondary | ICD-10-CM | POA: Diagnosis not present

## 2018-07-07 DIAGNOSIS — R3912 Poor urinary stream: Secondary | ICD-10-CM | POA: Diagnosis not present

## 2018-08-17 DIAGNOSIS — R350 Frequency of micturition: Secondary | ICD-10-CM | POA: Diagnosis not present

## 2018-08-17 DIAGNOSIS — Z8546 Personal history of malignant neoplasm of prostate: Secondary | ICD-10-CM | POA: Diagnosis not present

## 2018-10-19 ENCOUNTER — Ambulatory Visit: Payer: 59 | Admitting: Neurology

## 2019-02-09 LAB — PSA: PSA: 1.61

## 2019-02-12 NOTE — Progress Notes (Signed)
Subjective:   Adam Gaines was seen in consultation in the movement disorder clinic at the request of Chesley Noon, MD.  The evaluation is for tremor.  He is accompanied by his wife who supplements the history.  The patient is a 73 y.o. right handed male with a history of tremor.  Pt states that "my penmenship now sucks."  He has trouble with tremor when picking up a heavy cup or with eating soup.  His daughter recently told him that she noted tremor and pushed for the referral.  He doesn't know if it is both hands that have the tremor as he is "very right handed."  Tremor started about a few years ago (documented in 11/2011 in records).  He doesn't think that it has changed since it has started.  There is no known family hx of tremor.    Affected by caffeine:  Unknown (drinks 2 cups coffee per day and no other sources of caffeine) Affected by alcohol:  No. Affected by stress:  No. Affected by fatigue:  No. Spills soup if on spoon:  Yes.   Spills glass of liquid if full:  Yes.    Affects ADL's (tying shoes, brushing teeth, etc):  No., except shaving (blade)  Other sx's:    No change in voice No swallowing trouble Sleeping well; no vivid dreams;  No trouble getting out of deep couch, chair, car except has stiffness Good balance No falls; had drop on L after back surgery - regained most of strength  Current/Previously tried tremor medications: n/a  Current medications that may exacerbate tremor:  none  Outside reports reviewed: historical medical records, lab reports and referral letter/letters.  09/24/16 update:  Patient seen today in follow-up.  I have not seen him in almost 2 years.  At that time, he had evidence of essential tremor and no evidence of a neurodegenerative tremor.  He did have evidence of peripheral neuropathy.  I have reviewed records since last visit.  Today, the patient states That he just wanted to be checked again, as the patient's wife thought his tremor  has gotten worse.  He thinks that his tremor has been stable.  It is more in the R hand but he is R hand dominant.  It is worse after he exercises, especially after body pump or pilates.  It will affect shaving.  He notes holding full cup of water is a challenge as is eating soup.  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  He notes some tremor with using the mouse.    10/16/17 update: The patient is seen today in follow-up.  I last saw him about a year ago.  He reports that tremor has been about the same.  At times, he will think that tremor is worse in the right hand but other times not.  He doesn't want any medication.  He is still working.  "It doesn't affect my golf game."  It is worse after exercising. It is not interfering with activities of daily living unless he shaves after exercising.    The records that were made available to me were reviewed.  Has been to see cardiology in Jan.  Saw urology and had slightly elevated PSA and is to have that repeated.  02/18/19 update: Patient seen today in follow-up for tremor.  He is on no medication for tremor, which has been his choice.  He thinks that tremor has been about the same.  He does state  that his penmenship is worse.  He notes that morning is worse for tremor.   He is using peloton for power walking.  Had radiation seeds for prostate CA and is doing well in that regard.  No Known Allergies  Outpatient Encounter Medications as of 02/18/2019  Medication Sig  . Ascorbic Acid (VITAMIN C PO) Take 1,000 mg by mouth daily.   Marland Kitchen atorvastatin (LIPITOR) 20 MG tablet TAKE 1 TABLET BY MOUTH  DAILY  . b complex vitamins tablet Take 1 tablet by mouth daily.  . Cholecalciferol (VITAMIN D) 2000 UNITS tablet Take 1 tablet (2,000 Units total) by mouth daily.  . mirabegron ER (MYRBETRIQ) 50 MG TB24 tablet Take 50 mg by mouth daily.  . tamsulosin (FLOMAX) 0.4 MG CAPS capsule Take 0.4 mg by mouth. Take 2 tablets daily  . TURMERIC PO Take 1 capsule  by mouth daily.  . predniSONE (DELTASONE) 10 MG tablet Take 10 mg by mouth daily as needed.    No facility-administered encounter medications on file as of 02/18/2019.     Past Medical History:  Diagnosis Date  . AR (aortic regurgitation) 06/28/2016   Mild, noted on ECHO   . Benign essential tremor 11/18/2011   Seen for this November 18, 2011   . Bilateral cataracts   . Cardiovascular risk factor 03/08/2012  . Cervical spondylosis 12/18/2017   noted on CT  . Chickenpox   . Chronic eczema    of the plantar aspect right foot  . Colon polyps    Tubular Adenoma 2008, and polyps 2018  . Diverticulosis   . Diverticulosis of sigmoid colon 07/19/2016   noted on colonoscopy  . DOE (dyspnea on exertion)   . Grade II diastolic dysfunction [id number]   noted on ECHO   . Internal hemorrhoids 07/19/2016   noted on colonoscopy  . Intestinal angiodysplasia   . Lumbago   . LVH (left ventricular hypertrophy) 06/28/2016   Mild, noted on ECHO   . Other and unspecified hyperlipidemia   . Personal history of other disorder of urinary system   . Prostate cancer ([place])   . Seasonal allergies   . TONSILLECTOMY AND ADENOIDECTOMY, HX OF 07/24/2007   Qualifier: Diagnosis of  By: [place], [place]    . Vitamin B12 deficiency   . Vitamin D deficiency     Past Surgical History:  Procedure Laterality Date  . CATARACT EXTRACTION, BILATERAL     Left 06/25/2017, Right 07/09/2017  . COLONOSCOPY W/ POLYPECTOMY  07/19/2016  . left L5 hemilaminotomy with microdiskectomy    . RADIOACTIVE SEED IMPLANT N/A 03/11/2018   Procedure: RADIOACTIVE SEED IMPLANT/BRACHYTHERAPY IMPLANT;  Surgeon: Ardis Hughs, MD;  Location: [facility];  Service: Urology;  Laterality: N/A;  . repair of deviated septum    . SPACE OAR INSTILLATION N/A 03/11/2018   Procedure: SPACE OAR INSTILLATION;  Surgeon: Ardis Hughs, MD;  Location: [facility];  Service: Urology;  Laterality: N/A;  .  TONSILLECTOMY      Social History   Socioeconomic History  . Marital status: Married    Spouse name: Not on file  . Number of children: 2  . Years of education: 22  . Highest education level: Doctorate  Occupational History  . Occupation: Engineer, petroleum: Warden/ranger    Comment: attorney, business law  Social Needs  . Financial resource strain: Not on file  . Food insecurity    Worry: Not on file    Inability:  Not on file  . Transportation needs    Medical: Not on file    Non-medical: Not on file  Tobacco Use  . Smoking status: Former Smoker    Years: 10.00    Types: Cigarettes    Quit date: 11/28/1978    Years since quitting: 40.2  . Smokeless tobacco: Never Used  Substance and Sexual Activity  . Alcohol use: Yes    Alcohol/week: 5.0 standard drinks    Types: 3 Glasses of wine, 2 Standard drinks or equivalent per week    Comment: social  . Drug use: No  . Sexual activity: Yes    Partners: Female  Lifestyle  . Physical activity    Days per week: Not on file    Minutes per session: Not on file  . Stress: Not on file  Relationships  . Social Herbalist on phone: Not on file    Gets together: Not on file    Attends religious service: Not on file    Active member of club or organization: Not on file    Attends meetings of clubs or organizations: Not on file    Relationship status: Not on file  . Intimate partner violence    Fear of current or ex partner: No    Emotionally abused: No    Physically abused: No    Forced sexual activity: No  Other Topics Concern  . Not on file  Social History Narrative   HSG, [place], [employer] [place] JD '70. [employer] '70-'74. Marriage '67 and  in good health. 2 dtrs - '69, '72; 6 grandchildren. Work - Furniture conservator/restorer.                      Family Status  Relation Name Status  . Mother  Deceased at age 66       MI  . Father  Deceased at age 36       CAD/MI  . Daughter   Alive       [phone]  . Daughter  Alive       [phone]  . Brother  Alive  . Brother  Alive  . MGM  Deceased  . Other family hx of (Not Specified)  . PGM  Deceased  . MGF  Deceased  . PGF  Deceased  . Neg Hx  (Not Specified)    Review of Systems Review of Systems  Constitutional: Negative.   HENT: Negative.   Eyes: Negative.   Respiratory: Negative.   Cardiovascular: Negative.   Gastrointestinal: Negative.   Genitourinary: Positive for urgency.  Musculoskeletal: Negative.   Skin: Negative.      Objective:   VITALS:   Vitals:   02/18/19 0959  BP: 132/79  Pulse: 81  SpO2: 98%  Weight: 157 lb 9.6 oz (71.5 kg)  Height: 5\' 7"  (1.702 m)   GEN:  The patient appears stated age and is in NAD. HEENT:  Normocephalic, atraumatic.  The mucous membranes are moist. The superficial temporal arteries are without ropiness or tenderness. CV:  RRR Lungs:  CTAB Neck/HEME:  There are no carotid bruits bilaterally.  Neurological examination:  Orientation: The patient is alert and oriented x3. Cranial nerves: There is good facial symmetry. The speech is fluent and clear. Soft palate rises symmetrically and there is no tongue deviation. Hearing is intact to conversational tone. Sensation: Sensation is intact to light touch throughout Motor: Strength is 5/5 in the  bilateral upper and lower extremities.   Shoulder shrug is equal and symmetric.  There is no pronator drift.  Movement examination: Tone: There is normal tone in the UE/LE Abnormal movements: there is no rest tremor.  There is mod postural tremor on the right.  he has difficulty with archimedes spirals on the right, esp when the wrist is unrested Coordination:  There is no decremation with RAM's, with any form of RAMS, including alternating supination and pronation of the forearm, hand opening and closing, finger taps, heel taps and toe taps. Gait and Station: The patient has no difficulty arising out of a deep-seated chair without the  use of the hands. The patient's stride length is good.     LABS  Lab Results  Component Value Date   VITAMINB12 730 10/16/2017   Lab Results  Component Value Date   TSH 1.74 04/30/2017   Lab Results  Component Value Date   WBC 4.2 03/11/2018   HGB 13.5 03/11/2018   HCT 40.6 03/11/2018   MCV 94.9 03/11/2018   PLT 160 03/11/2018     Chemistry      Component Value Date/Time   NA 141 03/11/2018 0814   K 4.4 03/11/2018 0814   CL 106 03/11/2018 0814   CO2 28 03/11/2018 0814   BUN 20 03/11/2018 0814   CREATININE 0.78 03/11/2018 0814      Component Value Date/Time   CALCIUM 9.3 03/11/2018 0814   ALKPHOS 32 (L) 03/11/2018 0814   AST 39 03/11/2018 0814   ALT 35 03/11/2018 0814   BILITOT 0.8 03/11/2018 0814     Lab Results  Component Value Date   HGBA1C 5.3 11/09/2014        Assessment/Plan:   1.  Essential Tremor.  -Reassurance that this is not PD and see no evidence of PD (he still worries about this)  -tremor has slightly progressed with time but doesn't want medication.  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   2.  Possible peripheral neuropathy  -evidence of this on examination but clinically doesn't c/o sx's.   3.  B12 deficiency  -on supplement now.  Level is good on supplementation 4.  Pt would like to continue to follow here on a yearly basis.  Will see him in one year.

## 2019-02-18 ENCOUNTER — Encounter: Payer: Self-pay | Admitting: Neurology

## 2019-02-18 ENCOUNTER — Other Ambulatory Visit: Payer: Self-pay

## 2019-02-18 ENCOUNTER — Ambulatory Visit (INDEPENDENT_AMBULATORY_CARE_PROVIDER_SITE_OTHER): Payer: 59 | Admitting: Neurology

## 2019-02-18 VITALS — BP 132/79 | HR 81 | Ht 67.0 in | Wt 157.6 lb

## 2019-02-18 DIAGNOSIS — G250 Essential tremor: Secondary | ICD-10-CM

## 2019-03-05 ENCOUNTER — Ambulatory Visit: Payer: 59 | Admitting: Neurology

## 2019-03-17 DIAGNOSIS — E785 Hyperlipidemia, unspecified: Secondary | ICD-10-CM

## 2019-03-17 DIAGNOSIS — I5189 Other ill-defined heart diseases: Secondary | ICD-10-CM

## 2019-03-17 DIAGNOSIS — I351 Nonrheumatic aortic (valve) insufficiency: Secondary | ICD-10-CM

## 2019-03-19 NOTE — Telephone Encounter (Signed)
Pt had lipids checked here last December. Would like to have again prior to next visit with Dr.Ross.  Will route to Dr. Harrington Challenger to review if she would like to order lipids and any other labs prior to next appointment 05/10/19.

## 2019-03-22 NOTE — Telephone Encounter (Signed)
Reviewed with Dr. Harrington Challenger. Labs ordered. Pt made aware of appointment via [place]

## 2019-04-06 ENCOUNTER — Other Ambulatory Visit: Payer: Self-pay | Admitting: Internal Medicine

## 2019-04-06 DIAGNOSIS — I5189 Other ill-defined heart diseases: Secondary | ICD-10-CM

## 2019-04-06 MED ORDER — ATORVASTATIN CALCIUM 20 MG PO TABS: 20.0000 mg | ORAL_TABLET | Freq: Every day | ORAL | 0 refills | Status: DC

## 2019-04-23 DIAGNOSIS — Z20828 Contact with and (suspected) exposure to other viral communicable diseases: Secondary | ICD-10-CM | POA: Diagnosis not present

## 2019-04-25 ENCOUNTER — Other Ambulatory Visit: Payer: Self-pay | Admitting: Internal Medicine

## 2019-04-25 DIAGNOSIS — I5189 Other ill-defined heart diseases: Secondary | ICD-10-CM

## 2019-05-05 ENCOUNTER — Other Ambulatory Visit: Payer: Self-pay

## 2019-05-05 ENCOUNTER — Other Ambulatory Visit: Payer: BC Managed Care – PPO | Admitting: *Deleted

## 2019-05-05 DIAGNOSIS — E785 Hyperlipidemia, unspecified: Secondary | ICD-10-CM

## 2019-05-05 DIAGNOSIS — I5189 Other ill-defined heart diseases: Secondary | ICD-10-CM | POA: Diagnosis not present

## 2019-05-05 LAB — BASIC METABOLIC PANEL
BUN/Creatinine Ratio: 18 (ref 10–24)
BUN: 16 mg/dL (ref 8–27)
CO2: 26 mmol/L (ref 20–29)
Calcium: 9.6 mg/dL (ref 8.6–10.2)
Chloride: 103 mmol/L (ref 96–106)
Creatinine, Ser: 0.9 mg/dL (ref 0.76–1.27)
GFR calc Af Amer: 96 mL/min/{1.73_m2} (ref >59)
GFR calc non Af Amer: 83 mL/min/{1.73_m2} (ref >59)
Glucose: 96 mg/dL (ref 65–99)
Potassium: 4.7 mmol/L (ref 3.5–5.2)
Sodium: 141 mmol/L (ref 134–144)

## 2019-05-05 LAB — CBC
Hematocrit: 43.9 % (ref 37.5–51.0)
Hemoglobin: 14.9 g/dL (ref 13.0–17.7)
MCH: 32 pg (ref 26.6–33.0)
MCHC: 33.9 g/dL (ref 31.5–35.7)
MCV: 94 fL (ref 79–97)
Platelets: 177 10*3/uL (ref 150–450)
RBC: 4.66 x10E6/uL (ref 4.14–5.80)
RDW: 12.1 % (ref 11.6–15.4)
WBC: 5 10*3/uL (ref 3.4–10.8)

## 2019-05-05 LAB — LIPID PANEL
Chol/HDL Ratio: 1.8 ratio (ref 0.0–5.0)
Cholesterol, Total: 195 mg/dL (ref 100–199)
HDL: 106 mg/dL (ref >39)
LDL Chol Calc (NIH): 78 mg/dL (ref 0–99)
Triglycerides: 59 mg/dL (ref 0–149)
VLDL Cholesterol Cal: 11 mg/dL (ref 5–40)

## 2019-05-08 NOTE — Progress Notes (Signed)
Cardiology Office Note   Date:  05/10/2019   ID:  Bahe, Sankey Aug 28, 1945, MRN [id number]  PCP:  Eartha Inch, MD  Cardiologist:   Dietrich Pates, MD   F/U of dyslipidemia     History of Present Illness: Adam Gaines is a 73 y.o. male with a history ofhyperlipidemia and family history of coronary artery disease, who presents for follow-up of hyperlipidemia and exertional dyspnea. He was previously seen by [facility] and then [facility].   Echocardiogram showed preserved LVEF with grade 2 diastolic dysfunction and mild aortic regurgitation. Since the pt was last seen in clinic he has been treated with XRT for prostate CA  I saw the pt in Nov 2019    Since seen he has done well  Denies CP   Breathing is OK  No dizziness  No palpitations    Very active     Current Meds  Medication Sig  . Ascorbic Acid (VITAMIN C PO) Take 500 mg by mouth daily.   Marland Kitchen atorvastatin (LIPITOR) 20 MG tablet TAKE 1 TABLET BY MOUTH  DAILY  . b complex vitamins tablet Take 1 tablet by mouth daily.  . Cholecalciferol (VITAMIN D) 2000 UNITS tablet Take 1 tablet (2,000 Units total) by mouth daily.  . mirabegron ER (MYRBETRIQ) 50 MG TB24 tablet Take 50 mg by mouth daily.  . tamsulosin (FLOMAX) 0.4 MG CAPS capsule Take 0.4 mg by mouth. Take 2 tablets daily  . TURMERIC PO Take 1 capsule by mouth daily.     Allergies:   Patient has no known allergies.   Past Medical History:  Diagnosis Date  . AR (aortic regurgitation) 06/28/2016   Mild, noted on ECHO   . Benign essential tremor 11/18/2011   Seen for this November 18, 2011   . Bilateral cataracts   . Cardiovascular risk factor 03/08/2012  . Cervical spondylosis 12/18/2017   noted on CT  . Chickenpox   . Chronic eczema    of the plantar aspect right foot  . Colon polyps    Tubular Adenoma 2008, and polyps 2018  . Diverticulosis   . Diverticulosis of sigmoid colon 07/19/2016   noted on colonoscopy  . DOE (dyspnea on exertion)   . Grade II  diastolic dysfunction 06/28/2016   noted on ECHO   . Internal hemorrhoids 07/19/2016   noted on colonoscopy  . Intestinal angiodysplasia   . Lumbago   . LVH (left ventricular hypertrophy) 06/28/2016   Mild, noted on ECHO   . Other and unspecified hyperlipidemia   . Personal history of other disorder of urinary system   . Prostate cancer ([facility])   . Seasonal allergies   . TONSILLECTOMY AND ADENOIDECTOMY, HX OF 07/24/2007   Qualifier: Diagnosis of  By: Genelle Gather CMA, [place]    . Vitamin B12 deficiency   . Vitamin D deficiency     Past Surgical History:  Procedure Laterality Date  . CATARACT EXTRACTION, BILATERAL     Left 06/25/2017, Right 07/09/2017  . COLONOSCOPY W/ POLYPECTOMY  07/19/2016  . left L5 hemilaminotomy with microdiskectomy    . RADIOACTIVE SEED IMPLANT N/A 03/11/2018   Procedure: RADIOACTIVE SEED IMPLANT/BRACHYTHERAPY IMPLANT;  Surgeon: Crist Fat, MD;  [facility];  Service: Urology;  Laterality: N/A;  . repair of deviated septum    . SPACE OAR INSTILLATION N/A 03/11/2018   Procedure: SPACE OAR INSTILLATION;  Surgeon: Crist Fat, MD;  Location: [facility];  Service: Urology;  Laterality: N/A;  . TONSILLECTOMY       Social History:  The patient  reports that he quit smoking about 40 years ago. His smoking use included cigarettes. He quit after 10.00 years of use. He has never used smokeless tobacco. He reports current alcohol use of about 5.0 standard drinks of alcohol per week. He reports that he does not use drugs.   Family History:  The patient's family history includes COPD in his mother; Cancer in his maternal grandmother; Coronary artery disease in his father and mother; Diabetes in his maternal grandmother and paternal grandmother; Healthy in his brother, brother, daughter, and daughter; Heart attack (age of onset: 14) in his father; Heart attack (age of onset: 52) in his mother; Heart disease in his father and  mother; Hyperlipidemia in his father; Hypertension in his father; Other in an other family member.    ROS:  Please see the history of present illness. All other systems are reviewed and  Negative to the above problem except as noted.    PHYSICAL EXAM: VS:  BP 134/70   Pulse 69   Ht 5\' 7"  (1.702 m)   Wt 159 lb 12.8 oz (72.5 kg)   BMI 25.03 kg/m   GEN: Well nourished, well developed, in no acute distress  HEENT: normal  Neck: JVP is normal.  No carotid bruits Cardiac: RRR; no murmurs, rubs, or gallops,no edema  Respiratory:  clear to auscultation bilaterally, normal work of breathing GI: soft, nontender, nondistended, + BS  No hepatomegaly  MS: no deformity Moving all extremities   Skin: warm and dry, no rash Neuro:  Strength and sensation are intact Psych: euthymic mood, full affect   EKG:  EKG is ordered today.SR 69 bpm     Lipid Panel    Component Value Date/Time   CHOL 195 05/05/2019 0733   CHOL 203 (H) 05/09/2014 0733   TRIG 59 05/05/2019 0733   TRIG 106 05/09/2014 0733   HDL 106 05/05/2019 0733   HDL 85 05/09/2014 0733   CHOLHDL 1.8 05/05/2019 0733   CHOLHDL 2 04/30/2017 0724   VLDL 15.2 04/30/2017 0724   LDLCALC 78 05/05/2019 0733   LDLCALC 97 05/09/2014 0733      Wt Readings from Last 3 Encounters:  05/10/19 159 lb 12.8 oz (72.5 kg)  02/18/19 157 lb 9.6 oz (71.5 kg)  04/28/18 155 lb (70.3 kg)      ASSESSMENT AND PLAN:  1 HL   Lpidis are excellent   Keep on current regimen   2  Hx AI   Mild on echo in 2018   I do not hear a murmur today follow clinically.   HCM  COngratulated him on work outs he is doing with [employer]    Continue Encouraged pulse ox for COVID   Also recomm a BP cuff to follow randomly    Discussed Vitamin doses during COVID pandemic  WIll be available as needed in interval   Otherwise F/U in 1 year    Current medicines are reviewed at length with the patient today.  The patient does not have concerns regarding  medicines.  Signed, Dietrich Pates, MD  05/10/2019 8:39 AM    [facility] [address] [place], [place], [place]  [address] Phone: [phone]; Fax: [phone]

## 2019-05-10 ENCOUNTER — Ambulatory Visit: Payer: BC Managed Care – PPO | Admitting: Internal Medicine

## 2019-05-10 ENCOUNTER — Other Ambulatory Visit: Payer: Self-pay

## 2019-05-10 ENCOUNTER — Encounter: Payer: Self-pay | Admitting: Internal Medicine

## 2019-05-10 VITALS — BP 134/70 | HR 69 | Ht 67.0 in | Wt 159.8 lb

## 2019-05-10 DIAGNOSIS — I351 Nonrheumatic aortic (valve) insufficiency: Secondary | ICD-10-CM | POA: Diagnosis not present

## 2019-05-10 NOTE — Patient Instructions (Signed)
Medication Instructions:  No changes *If you need a refill on your cardiac medications before your next appointment, please call your pharmacy*  Lab Work: none If you have labs (blood work) drawn today and your tests are completely normal, you will receive your results only by: Marland Kitchen MyChart Message (if you have MyChart) OR . A paper copy in the mail If you have any lab test that is abnormal or we need to change your treatment, we will call you to review the results.  Testing/Procedures: none  Follow-Up: At [facility], you and your health needs are our priority.  As part of our continuing mission to provide you with exceptional heart care, we have created designated Provider Care Teams.  These Care Teams include your primary Cardiologist (physician) and Advanced Practice Providers (APPs -  Physician Assistants and Nurse Practitioners) who all work together to provide you with the care you need, when you need it.  Your next appointment:   12 months  The format for your next appointment:   In Person  Provider:   You may see Dr. Harrington Challenger or one of the following Advanced Practice Providers on your designated Care Team:    Richardson Dopp, PA-C  [place], [place]  Daune Perch, NP   Other Instructions

## 2019-06-10 DIAGNOSIS — Z23 Encounter for immunization: Secondary | ICD-10-CM | POA: Diagnosis not present

## 2019-06-28 ENCOUNTER — Other Ambulatory Visit: Payer: Self-pay

## 2019-06-28 DIAGNOSIS — D225 Melanocytic nevi of trunk: Secondary | ICD-10-CM | POA: Diagnosis not present

## 2019-06-28 DIAGNOSIS — L820 Inflamed seborrheic keratosis: Secondary | ICD-10-CM | POA: Diagnosis not present

## 2019-06-28 DIAGNOSIS — L821 Other seborrheic keratosis: Secondary | ICD-10-CM | POA: Diagnosis not present

## 2019-06-28 DIAGNOSIS — L570 Actinic keratosis: Secondary | ICD-10-CM | POA: Diagnosis not present

## 2019-06-28 DIAGNOSIS — I5189 Other ill-defined heart diseases: Secondary | ICD-10-CM

## 2019-06-28 DIAGNOSIS — Z85828 Personal history of other malignant neoplasm of skin: Secondary | ICD-10-CM | POA: Diagnosis not present

## 2019-06-28 DIAGNOSIS — D1801 Hemangioma of skin and subcutaneous tissue: Secondary | ICD-10-CM | POA: Diagnosis not present

## 2019-06-28 MED ORDER — ATORVASTATIN CALCIUM 20 MG PO TABS: 20.0000 mg | ORAL_TABLET | Freq: Every day | ORAL | 2 refills | Status: DC

## 2019-07-01 DIAGNOSIS — Z23 Encounter for immunization: Secondary | ICD-10-CM | POA: Diagnosis not present

## 2019-08-03 DIAGNOSIS — Z8546 Personal history of malignant neoplasm of prostate: Secondary | ICD-10-CM | POA: Diagnosis not present

## 2019-08-09 DIAGNOSIS — Z8546 Personal history of malignant neoplasm of prostate: Secondary | ICD-10-CM | POA: Diagnosis not present

## 2019-08-09 DIAGNOSIS — R350 Frequency of micturition: Secondary | ICD-10-CM | POA: Diagnosis not present

## 2019-08-09 DIAGNOSIS — N5201 Erectile dysfunction due to arterial insufficiency: Secondary | ICD-10-CM | POA: Diagnosis not present

## 2019-08-23 DIAGNOSIS — Z20822 Contact with and (suspected) exposure to covid-19: Secondary | ICD-10-CM | POA: Diagnosis not present

## 2019-11-04 DIAGNOSIS — Z961 Presence of intraocular lens: Secondary | ICD-10-CM | POA: Diagnosis not present

## 2020-02-14 ENCOUNTER — Ambulatory Visit: Payer: BC Managed Care – PPO | Admitting: Neurology

## 2020-02-21 ENCOUNTER — Ambulatory Visit: Payer: 59 | Admitting: Neurology

## 2020-03-09 ENCOUNTER — Other Ambulatory Visit: Payer: Self-pay | Admitting: Physical Medicine & Rehabilitation

## 2020-03-09 ENCOUNTER — Other Ambulatory Visit (HOSPITAL_COMMUNITY): Payer: Self-pay | Admitting: Physical Medicine & Rehabilitation

## 2020-03-09 DIAGNOSIS — M542 Cervicalgia: Secondary | ICD-10-CM

## 2020-03-09 DIAGNOSIS — G44209 Tension-type headache, unspecified, not intractable: Secondary | ICD-10-CM

## 2020-03-09 DIAGNOSIS — M546 Pain in thoracic spine: Secondary | ICD-10-CM

## 2020-03-12 ENCOUNTER — Ambulatory Visit (HOSPITAL_COMMUNITY)
Admission: RE | Admit: 2020-03-12 | Discharge: 2020-03-12 | Disposition: A | Discharge status: Home / Self Care | Payer: BC Managed Care – PPO | Source: Ambulatory Visit | Attending: Physical Medicine & Rehabilitation | Admitting: Physical Medicine & Rehabilitation

## 2020-03-12 ENCOUNTER — Other Ambulatory Visit: Payer: Self-pay

## 2020-03-12 DIAGNOSIS — M542 Cervicalgia: Secondary | ICD-10-CM | POA: Diagnosis present

## 2020-03-12 DIAGNOSIS — M546 Pain in thoracic spine: Secondary | ICD-10-CM | POA: Diagnosis present

## 2020-03-12 DIAGNOSIS — G44209 Tension-type headache, unspecified, not intractable: Secondary | ICD-10-CM

## 2020-03-12 MED ORDER — GADOBUTROL 1 MMOL/ML IV SOLN
7.0000 mL | Freq: Once | INTRAVENOUS | Status: AC | PRN
  Administered 2020-03-12: 7 mL via INTRAVENOUS

## 2020-05-10 IMAGING — DX DG CHEST 2V
2 series · 2 of 2 positions shown · non-contrast
Comparison: None.

CLINICAL DATA: Cough and sore throat

EXAM:
CHEST - 2 VIEW

[chest pa]
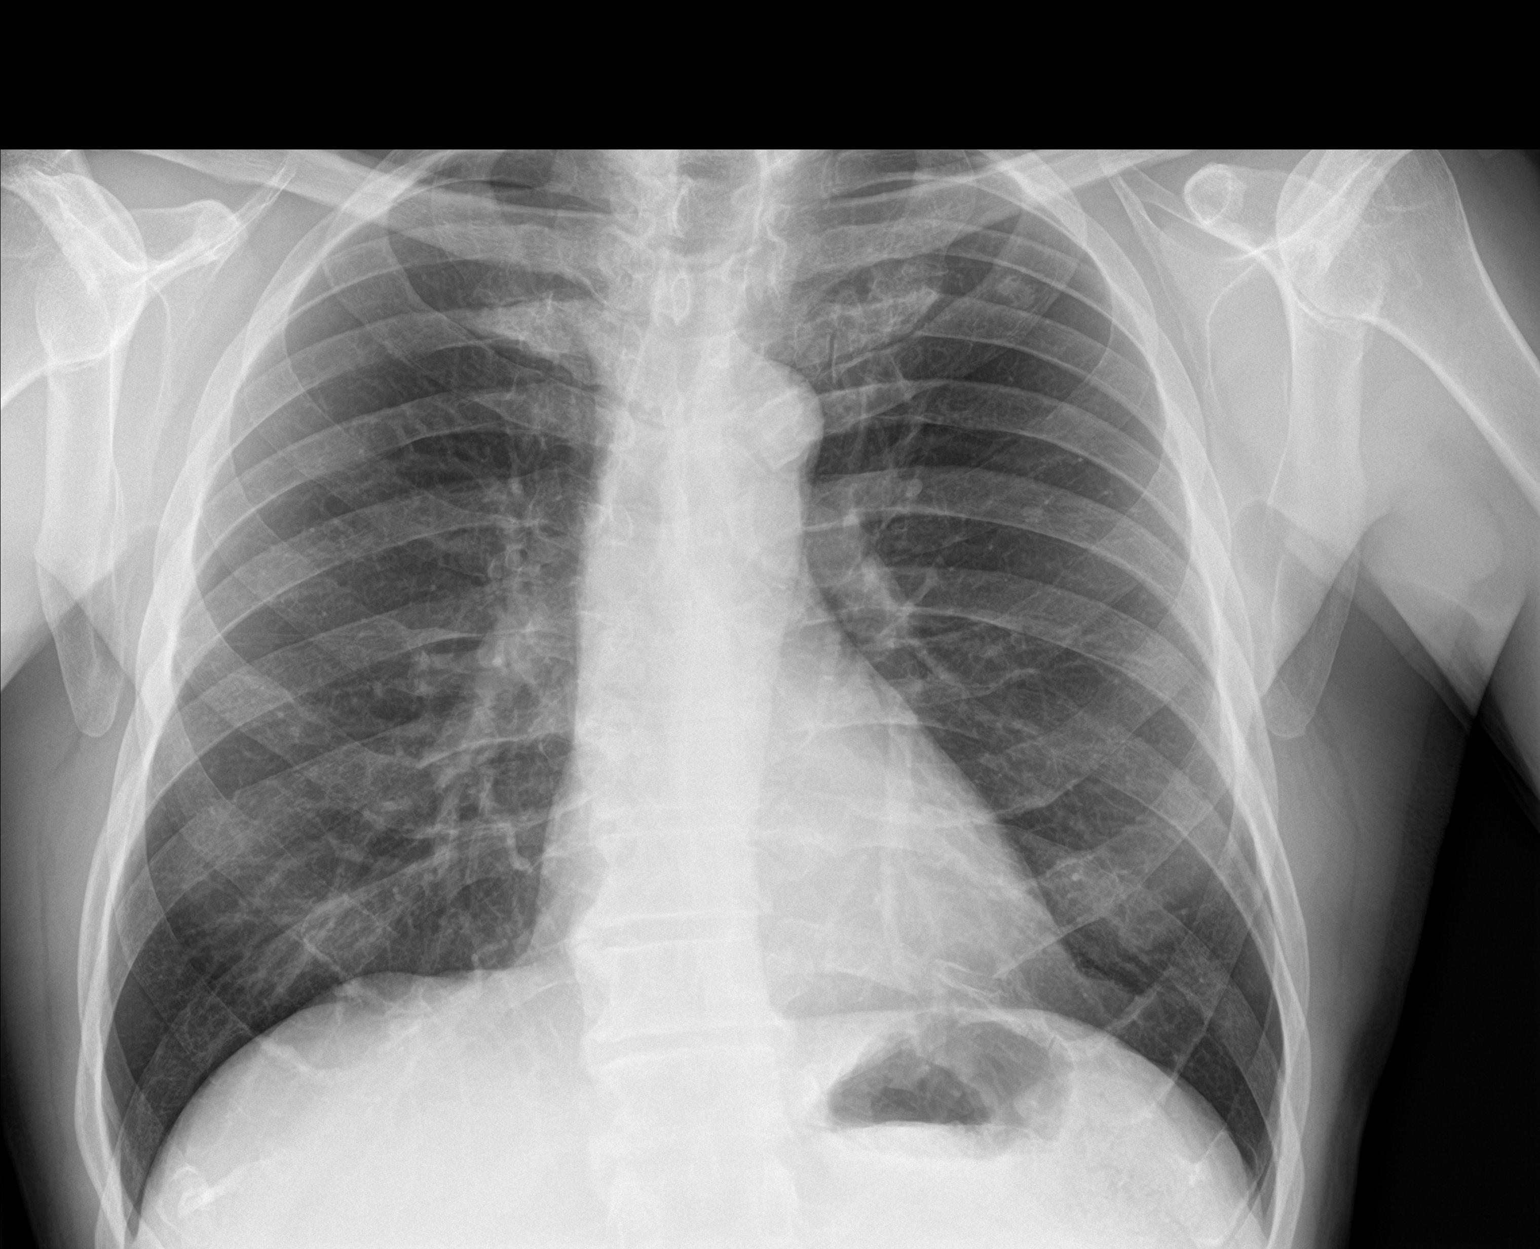

[chest lat]
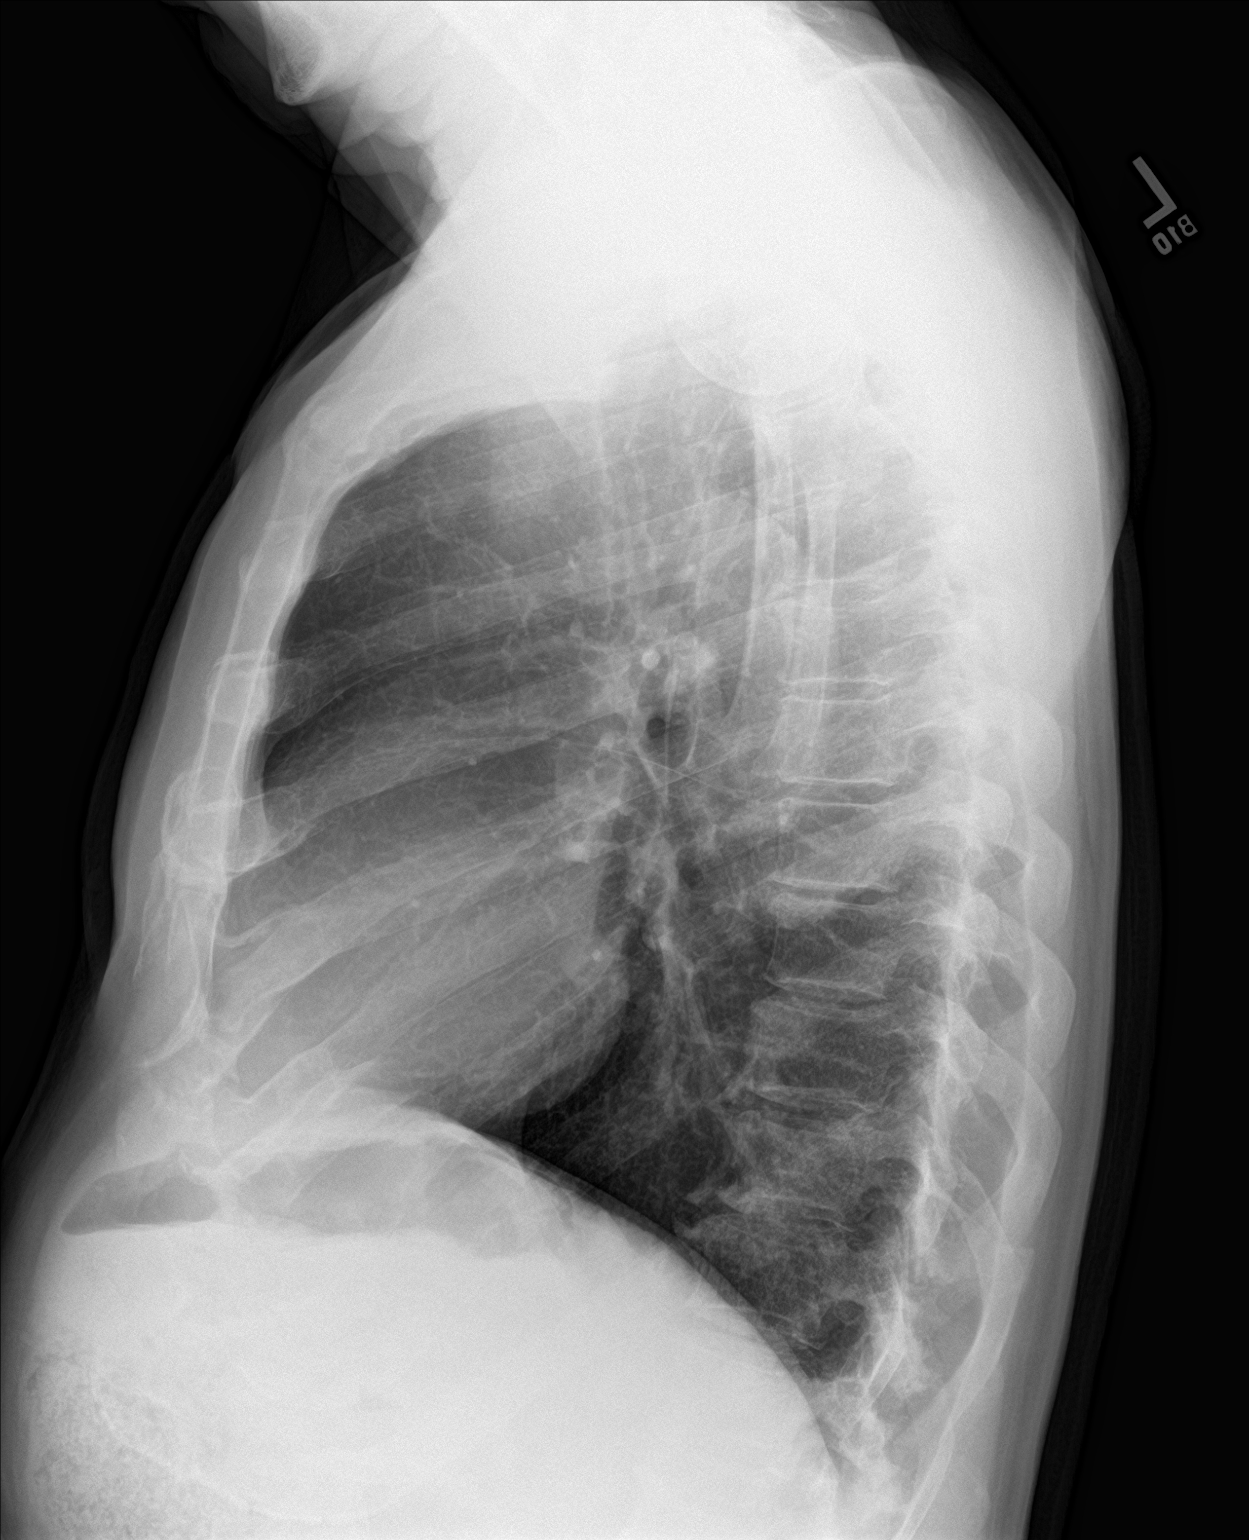

[2 of 2 positions shown; findings below may reference images not displayed]

FINDINGS: Cardiac shadow is within normal limits. The lungs are well aerated
bilaterally. Calcific density is noted over the anterior aspect of
the left second rib as well as some other scattered calcifications
consistent with prior granulomatous disease. No acute bony
abnormality is noted.
IMPRESSION: Changes of prior granulomatous disease without acute abnormality.

## 2020-06-11 NOTE — Progress Notes (Signed)
Cardiology Office Note   Date:  06/12/2020   ID:  Adam Gaines, Adam Gaines 1945/07/09, MRN [id number]  PCP:  Eartha Inch, MD  Cardiologist:   Dietrich Pates, MD   F/U of dyslipidemia     History of Present Illness: Adam Gaines is a 75 y.o. male with a history of prostate cancer,  hyperlipidemia and dyspnea He also has a FHx of CAD  Calcum score CT in 2013 was 0 Echocardiogram showed preserved LVEF with grade 2 diastolic dysfunction and mild aortic regurgitation. I aw the pt in Dec 2020  Since seen he has done well  Breathing is OK   He denies CP   He remains very active   Current Meds  Medication Sig   Ascorbic Acid (VITAMIN C PO) Take 500 mg by mouth daily.    atorvastatin (LIPITOR) 20 MG tablet Take 1 tablet (20 mg total) by mouth daily.   b complex vitamins tablet Take 1 tablet by mouth daily.   celecoxib (CELEBREX) 200 MG capsule Take 200 mg by mouth as needed.   Cholecalciferol (VITAMIN D) 2000 UNITS tablet Take 1 tablet (2,000 Units total) by mouth daily.   CVS TRIPLE MAGNESIUM COMPLEX PO Take by mouth daily.   mirabegron ER (MYRBETRIQ) 50 MG TB24 tablet Take 50 mg by mouth daily.   tamsulosin (FLOMAX) 0.4 MG CAPS capsule Take 0.4 mg by mouth. Take 2 tablets daily   TURMERIC PO Take 1 capsule by mouth daily.     Allergies:   Patient has no known allergies.   Past Medical History:  Diagnosis Date   AR (aortic regurgitation) 06/28/2016   Mild, noted on ECHO    Benign essential tremor 11/18/2011   Seen for this November 18, 2011    Bilateral cataracts    Cardiovascular risk factor 03/08/2012   Cervical spondylosis 12/18/2017   noted on CT   Chickenpox    Chronic eczema    of the plantar aspect right foot   Colon polyps    Tubular Adenoma 2008, and polyps 2018   Diverticulosis    Diverticulosis of sigmoid colon 07/19/2016   noted on colonoscopy   DOE (dyspnea on exertion)    Grade II diastolic dysfunction 06/28/2016   noted on ECHO     Internal hemorrhoids 07/19/2016   noted on colonoscopy   Intestinal angiodysplasia    Lumbago    LVH (left ventricular hypertrophy) 06/28/2016   Mild, noted on ECHO    Other and unspecified hyperlipidemia    Personal history of other disorder of urinary system    Prostate cancer (HCC)    Seasonal allergies    TONSILLECTOMY AND ADENOIDECTOMY, HX OF 07/24/2007   Qualifier: Diagnosis of  By: Genelle Gather CMA, [place]     Vitamin B12 deficiency    Vitamin D deficiency     Past Surgical History:  Procedure Laterality Date   CATARACT EXTRACTION, BILATERAL     Left 06/25/2017, Right 07/09/2017   COLONOSCOPY W/ POLYPECTOMY  07/19/2016   left L5 hemilaminotomy with microdiskectomy     RADIOACTIVE SEED IMPLANT N/A 03/11/2018   Procedure: RADIOACTIVE SEED IMPLANT/BRACHYTHERAPY IMPLANT;  Surgeon: Crist Fat, MD;  Location[facility];  Service: Urology;  Laterality: N/A;   repair of deviated septum     SPACE OAR INSTILLATION N/A 03/11/2018   Procedure: SPACE OAR INSTILLATION;  Surgeon: Crist Fat, MD;  Location: [facility];  Service: Urology;  Laterality: N/A;   TONSILLECTOMY  Social History:  The patient  reports that he quit smoking about 41 years ago. His smoking use included cigarettes. He quit after 10.00 years of use. He has never used smokeless tobacco. He reports current alcohol use of about 5.0 standard drinks of alcohol per week. He reports that he does not use drugs.   Family History:  The patient's family history includes COPD in his mother; Cancer in his maternal grandmother; Coronary artery disease in his father and mother; Diabetes in his maternal grandmother and paternal grandmother; Healthy in his brother, brother, daughter, and daughter; Heart attack (age of onset: 72) in his father; Heart attack (age of onset: 75) in his mother; Heart disease in his father and mother; Hyperlipidemia in his father; Hypertension in his  father; Other in an other family member.    ROS:  Please see the history of present illness. All other systems are reviewed and  Negative to the above problem except as noted.    PHYSICAL EXAM: VS:  BP 134/78    Pulse 64    Ht 5\' 7"  (1.702 m)    Wt 160 lb (72.6 kg)    SpO2 98%    BMI 25.06 kg/m   GEN: Well nourished, well developed, in no acute distress  HEENT: normal  Neck: JVP is normal.  No carotid bruits Cardiac: RRR; no murmurs, rubs, or gallops,no LE  edema  Respiratory:  clear to auscultation bilaterally,  GI: soft, nontender, nondistended, + BS  No hepatomegaly  MS: no deformity Moving all extremities   Skin: warm and dry, no rash Neuro:  Strength and sensation are intact Psych: euthymic mood, full affect   EKG:  EKG is ordered today.SR 64 bpm     Lipid Panel    Component Value Date/Time   CHOL 195 05/05/2019 0733   CHOL 203 (H) 05/09/2014 0733   TRIG 59 05/05/2019 0733   TRIG 106 05/09/2014 0733   HDL 106 05/05/2019 0733   HDL 85 05/09/2014 0733   CHOLHDL 1.8 05/05/2019 0733   CHOLHDL 2 04/30/2017 0724   VLDL 15.2 04/30/2017 0724   LDLCALC 78 05/05/2019 0733   LDLCALC 97 05/09/2014 0733      Wt Readings from Last 3 Encounters:  06/12/20 160 lb (72.6 kg)  05/10/19 159 lb 12.8 oz (72.5 kg)  02/18/19 157 lb 9.6 oz (71.5 kg)      ASSESSMENT AND PLAN:  1 HL   Pt not fasting   Will return for labs this week     2  Hx AI   Mild on echo in 2018   No murmur on exam  Encouraged him to stay acitve     Will check CBC, CMET, TSH and Vit D    Otherwise F/U in 1 year    Current medicines are reviewed at length with the patient today.  The patient does not have concerns regarding medicines.  Signed, Dietrich Pates, MD  06/12/2020 10:28 AM    [facility] [address] [place], [place], [place]  [address] Phone: [phone]; Fax: [phone]

## 2020-06-12 ENCOUNTER — Other Ambulatory Visit: Payer: Self-pay

## 2020-06-12 ENCOUNTER — Encounter: Payer: Self-pay | Admitting: Internal Medicine

## 2020-06-12 ENCOUNTER — Ambulatory Visit: Payer: BC Managed Care – PPO | Admitting: Internal Medicine

## 2020-06-12 VITALS — BP 134/78 | HR 64 | Ht 67.0 in | Wt 160.0 lb

## 2020-06-12 DIAGNOSIS — Z79899 Other long term (current) drug therapy: Secondary | ICD-10-CM

## 2020-06-12 DIAGNOSIS — I5189 Other ill-defined heart diseases: Secondary | ICD-10-CM | POA: Diagnosis not present

## 2020-06-12 DIAGNOSIS — E785 Hyperlipidemia, unspecified: Secondary | ICD-10-CM

## 2020-06-12 MED ORDER — ATORVASTATIN CALCIUM 20 MG PO TABS: 20.0000 mg | ORAL_TABLET | Freq: Every day | ORAL | 3 refills | Status: DC

## 2020-06-12 NOTE — Patient Instructions (Signed)
Medication Instructions:  Your physician recommends that you continue on your current medications as directed. Please refer to the Current Medication list given to you today.  *If you need a refill on your cardiac medications before your next appointment, please call your pharmacy*   Lab Work: CBC, TSH, VIT D, CMET, LIPID  ON 06/13/20 If you have labs (blood work) drawn today and your tests are completely normal, you will receive your results only by: Marland Kitchen MyChart Message (if you have MyChart) OR . A paper copy in the mail If you have any lab test that is abnormal or we need to change your treatment, we will call you to review the results.   Testing/Procedures: NONE   Follow-Up: At [facility], you and your health needs are our priority.  As part of our continuing mission to provide you with exceptional heart care, we have created designated Provider Care Teams.  These Care Teams include your primary Cardiologist (physician) and Advanced Practice Providers (APPs -  Physician Assistants and Nurse Practitioners) who all work together to provide you with the care you need, when you need it.  We recommend signing up for the patient portal called "MyChart".  Sign up information is provided on this After Visit Summary.  MyChart is used to connect with patients for Virtual Visits (Telemedicine).  Patients are able to view lab/test results, encounter notes, upcoming appointments, etc.  Non-urgent messages can be sent to your provider as well.   To learn more about what you can do with MyChart, go to [email or web address].    Your next appointment:   1 year(s)  The format for your next appointment:   In Person  Provider:   Dorris Carnes, MD   Other Instructions

## 2020-06-13 ENCOUNTER — Other Ambulatory Visit: Payer: BC Managed Care – PPO | Admitting: *Deleted

## 2020-06-13 DIAGNOSIS — Z79899 Other long term (current) drug therapy: Secondary | ICD-10-CM

## 2020-06-13 DIAGNOSIS — E785 Hyperlipidemia, unspecified: Secondary | ICD-10-CM

## 2020-06-13 DIAGNOSIS — I5189 Other ill-defined heart diseases: Secondary | ICD-10-CM

## 2020-06-14 LAB — LIPID PANEL
Chol/HDL Ratio: 1.9 ratio (ref 0.0–5.0)
Cholesterol, Total: 194 mg/dL (ref 100–199)
HDL: 103 mg/dL (ref >39)
LDL Chol Calc (NIH): 81 mg/dL (ref 0–99)
Triglycerides: 52 mg/dL (ref 0–149)
VLDL Cholesterol Cal: 10 mg/dL (ref 5–40)

## 2020-06-14 LAB — COMPREHENSIVE METABOLIC PANEL
ALT: 30 IU/L (ref 0–44)
AST: 31 IU/L (ref 0–40)
Albumin/Globulin Ratio: 2.4 — ABNORMAL HIGH (ref 1.2–2.2)
Albumin: 4.4 g/dL (ref 3.7–4.7)
Alkaline Phosphatase: 45 IU/L (ref 44–121)
BUN/Creatinine Ratio: 17 (ref 10–24)
BUN: 14 mg/dL (ref 8–27)
Bilirubin Total: 0.4 mg/dL (ref 0.0–1.2)
CO2: 26 mmol/L (ref 20–29)
Calcium: 9.6 mg/dL (ref 8.6–10.2)
Chloride: 104 mmol/L (ref 96–106)
Creatinine, Ser: 0.81 mg/dL (ref 0.76–1.27)
GFR calc Af Amer: 100 mL/min/{1.73_m2} (ref >59)
GFR calc non Af Amer: 86 mL/min/{1.73_m2} (ref >59)
Globulin, Total: 1.8 g/dL (ref 1.5–4.5)
Glucose: 105 mg/dL — ABNORMAL HIGH (ref 65–99)
Potassium: 5 mmol/L (ref 3.5–5.2)
Sodium: 141 mmol/L (ref 134–144)
Total Protein: 6.2 g/dL (ref 6.0–8.5)

## 2020-06-14 LAB — CBC
Hematocrit: 44 % (ref 37.5–51.0)
Hemoglobin: 14.8 g/dL (ref 13.0–17.7)
MCH: 31.8 pg (ref 26.6–33.0)
MCHC: 33.6 g/dL (ref 31.5–35.7)
MCV: 94 fL (ref 79–97)
Platelets: 180 10*3/uL (ref 150–450)
RBC: 4.66 x10E6/uL (ref 4.14–5.80)
RDW: 12 % (ref 11.6–15.4)
WBC: 5.1 10*3/uL (ref 3.4–10.8)

## 2020-06-14 LAB — TSH: TSH: 2.03 u[IU]/mL (ref 0.450–4.500)

## 2020-06-14 LAB — VITAMIN D 25 HYDROXY (VIT D DEFICIENCY, FRACTURES): Vit D, 25-Hydroxy: 52.2 ng/mL (ref 30.0–100.0)

## 2021-02-13 ENCOUNTER — Encounter: Payer: Self-pay | Admitting: Genetic Counselor

## 2021-02-26 ENCOUNTER — Other Ambulatory Visit: Payer: Self-pay

## 2021-02-26 ENCOUNTER — Encounter: Payer: Self-pay | Admitting: Licensed Clinical Social Worker

## 2021-02-26 ENCOUNTER — Inpatient Hospital Stay: Payer: BC Managed Care – PPO | Attending: Genetic Counselor | Admitting: Licensed Clinical Social Worker

## 2021-02-26 ENCOUNTER — Inpatient Hospital Stay: Payer: BC Managed Care – PPO

## 2021-02-26 DIAGNOSIS — Z8042 Family history of malignant neoplasm of prostate: Secondary | ICD-10-CM

## 2021-02-26 DIAGNOSIS — C61 Malignant neoplasm of prostate: Secondary | ICD-10-CM

## 2021-02-26 DIAGNOSIS — Z806 Family history of leukemia: Secondary | ICD-10-CM | POA: Insufficient documentation

## 2021-02-26 DIAGNOSIS — Z803 Family history of malignant neoplasm of breast: Secondary | ICD-10-CM | POA: Insufficient documentation

## 2021-02-26 LAB — GENETIC SCREENING ORDER

## 2021-02-26 NOTE — Progress Notes (Signed)
REFERRING PROVIDER: Self-referred  PRIMARY PROVIDER:  Chesley Noon, MD  PRIMARY REASON FOR VISIT:  1. Prostate cancer ([place])   2. Family history of prostate cancer   3. Family history of breast cancer   4. Family history of leukemia      HISTORY OF PRESENT ILLNESS:   Adam Gaines, a 75 y.o. male, was seen for a [place] cancer genetics consultation due to a personal and family history of cancer.  Adam Gaines presents to clinic today to discuss the possibility of a hereditary predisposition to cancer, genetic testing, and to further clarify his future cancer risks, as well as potential cancer risks for family members.   In 2019, at the age of 67, Adam Gaines was diagnosed with stage T1c adenocarcinoma of the prostate with Gleason score of 3+4. This was treated with seed implantation. He has had colonoscopies and reportedly had polyps, patient believes less than 10 total. He also is followed by dermatology every 6 months.   CANCER HISTORY:  Oncology History   No history exists.    Past Medical History:  Diagnosis Date   AR (aortic regurgitation) 06/28/2016   Mild, noted on ECHO    Benign essential tremor 11/18/2011   Seen for this November 18, 2011    Bilateral cataracts    Cardiovascular risk factor 03/08/2012   Cervical spondylosis 12/18/2017   noted on CT   Chickenpox    Chronic eczema    of the plantar aspect right foot   Colon polyps    Tubular Adenoma 2008, and polyps 2018   Diverticulosis    Diverticulosis of sigmoid colon 07/19/2016   noted on colonoscopy   DOE (dyspnea on exertion)    Family history of breast cancer    Family history of leukemia    Family history of prostate cancer    Grade II diastolic dysfunction 70/06/7492   noted on ECHO    Internal hemorrhoids 07/19/2016   noted on colonoscopy   Intestinal angiodysplasia    Lumbago    LVH (left ventricular hypertrophy) 06/28/2016   Mild, noted on ECHO    Other and unspecified hyperlipidemia     Personal history of other disorder of urinary system    Prostate cancer ([place])    Seasonal allergies    TONSILLECTOMY AND ADENOIDECTOMY, HX OF 07/24/2007   Qualifier: Diagnosis of  By: Danny Lawless CMA, [place]     Vitamin B12 deficiency    Vitamin D deficiency     Past Surgical History:  Procedure Laterality Date   CATARACT EXTRACTION, BILATERAL     Left 06/25/2017, Right 07/09/2017   COLONOSCOPY W/ POLYPECTOMY  07/19/2016   left L5 hemilaminotomy with microdiskectomy     RADIOACTIVE SEED IMPLANT N/A 03/11/2018   Procedure: RADIOACTIVE SEED IMPLANT/BRACHYTHERAPY IMPLANT;  Surgeon: Ardis Hughs, MD;  Location: [facility];  Service: Urology;  Laterality: N/A;   repair of deviated septum     SPACE OAR INSTILLATION N/A 03/11/2018   Procedure: SPACE OAR INSTILLATION;  Surgeon: Ardis Hughs, MD;  Location[facility];  Service: Urology;  Laterality: N/A;   TONSILLECTOMY      Social History   Socioeconomic History   Marital status: Married    Spouse name: Not on file   Number of children: 2   Years of education: 77   Highest education level: Doctorate  Occupational History   Occupation: Engineer, petroleum: Warden/ranger    Comment: attorney, Chief Technology Officer  Tobacco Use   Smoking status: Former    Years: 10.00    Types: Cigarettes    Quit date: 11/28/1978    Years since quitting: 42.2   Smokeless tobacco: Never  Vaping Use   Vaping Use: Never used  Substance and Sexual Activity   Alcohol use: Yes    Alcohol/week: 5.0 standard drinks    Types: 3 Glasses of wine, 2 Standard drinks or equivalent per week    Comment: social   Drug use: No   Sexual activity: Yes    Partners: Female  Other Topics Concern   Not on file  Social History Narrative   HSG, [place], [place] '70. [employer] '70-'74. Marriage '67 and  in good health. 2 dtrs - '69, '72; 6 grandchildren. Work - Furniture conservator/restorer.                      Social Determinants of Health   Financial Resource Strain: Not on file  Food Insecurity: Not on file  Transportation Needs: Not on file  Physical Activity: Not on file  Stress: Not on file  Social Connections: Not on file     FAMILY HISTORY:  We obtained a detailed, 4-generation family history.  Significant diagnoses are listed below: Family History  Problem Relation Age of Onset   Heart attack Mother 66       after aspiration   Coronary artery disease Mother    Heart disease Mother    COPD Mother        smoker   Coronary artery disease Father    Heart disease Father    Hyperlipidemia Father    Hypertension Father    Heart attack Father 70       d. 96   Healthy Brother    Healthy Brother    Prostate cancer Brother 65   Diabetes Maternal Grandmother    Cancer Maternal Grandmother        unknown/lived to age 21; stomach cancer   Diabetes Paternal Grandmother    Breast cancer Paternal Grandmother        d. 59s   Healthy Daughter    Healthy Daughter    Other Other        cardiovascular disease   Colon cancer Neg Hx    Adam Gaines has 2 daughters, ages 86 and 37, no cancers. He has 2 brothers. One was recently diagnosed with prostate cancer at 45 and has not had genetic testing, he is being treated in [place].  Adam Gaines mother died of heart issues at 71. Patient had 1 maternal uncle and he died suddenly of leukemia at 53. Maternal grandmother had possible stomach cancer diagnosed at 58 and died at 75. Grandfather had a benign brain tumor, he passed at 52.  Adam Gaines father died at 66 of a heart attack. Patient had 2 paternal aunts, no cancer for them or for their children. Paternal grandfather died of kidney failure early in life. Grandmother had breast cancer and died in her 53s.  Adam Gaines is unaware of previous family history of genetic testing for hereditary cancer risks. Patient's maternal ancestors are of Ashkenazi Jewish descent, and paternal  ancestors are of [place] Jewish descent. There is no known consanguinity.    GENETIC COUNSELING ASSESSMENT: Adam Gaines is a 75 y.o. male with a personal and family history of cancer which is somewhat suggestive of a hereditary cancer syndrome and predisposition to cancer.  We, therefore, discussed and recommended the following at today's visit.   DISCUSSION: We discussed that approximately 5-10% of prostate cancer is hereditary. Most cases of hereditary prostate cancer are associated with BRCA1/BRCA2 genes, although there are other genes associated with hereditary prostate cancer as well. Cancers and risks are gene specific.  We discussed that testing is beneficial for several reasons including knowing about other cancer risks, identifying potential screening and risk-reduction options that may be appropriate, and to understand if other family members could be at risk for cancer and allow them to undergo genetic testing.   We reviewed the characteristics, features and inheritance patterns of hereditary cancer syndromes. We also discussed genetic testing, including the appropriate family members to test, the process of testing, insurance coverage and turn-around-time for results. We discussed the implications of a negative, positive and/or variant of uncertain significant result. We recommended Adam Gaines pursue genetic testing for the [facility] Multi-Cancer+RNA gene panel.   Based on Adam Gaines personal and family history of cancer, he meets medical criteria for genetic testing. Despite that he meets criteria, he may still have an out of pocket cost. We discussed that if his out of pocket cost for testing is over $100, the laboratory will call and confirm whether he wants to proceed with testing.  If the out of pocket cost of testing is less than $100 he will be billed by the genetic testing laboratory.   PLAN: After considering the risks, benefits, and limitations, Adam Gaines provided informed consent to  pursue genetic testing and the blood sample was sent to [facility] for analysis of the Multi-cancer+RNA panel. Results should be available within approximately 2-3 weeks' time, at which point they will be disclosed by telephone to Adam Gaines, as will any additional recommendations warranted by these results. Adam Gaines will receive a summary of his genetic counseling visit and a copy of his results once available. This information will also be available in Epic.   Adam Gaines questions were answered to his satisfaction today. Our contact information was provided should additional questions or concerns arise. Thank you for the referral and allowing [place] to share in the care of your patient.   Faith Rogue, MS, [facility] Genetic Counselor [email or web address] Phone: [phone]  The patient was seen for a total of 30 minutes in face-to-face genetic counseling.  Patient was seen alone.  Dr. Grayland Ormond was available for discussion regarding this case.   _______________________________________________________________________ For Office Staff:  Number of people involved in session: 1 Was an Intern/ student involved with case: no

## 2021-03-22 ENCOUNTER — Ambulatory Visit: Payer: Self-pay | Admitting: Licensed Clinical Social Worker

## 2021-03-22 ENCOUNTER — Encounter: Payer: Self-pay | Admitting: Licensed Clinical Social Worker

## 2021-03-22 ENCOUNTER — Telehealth: Payer: Self-pay | Admitting: Licensed Clinical Social Worker

## 2021-03-22 DIAGNOSIS — Z8042 Family history of malignant neoplasm of prostate: Secondary | ICD-10-CM

## 2021-03-22 DIAGNOSIS — C61 Malignant neoplasm of prostate: Secondary | ICD-10-CM

## 2021-03-22 DIAGNOSIS — Z1379 Encounter for other screening for genetic and chromosomal anomalies: Secondary | ICD-10-CM | POA: Insufficient documentation

## 2021-03-22 DIAGNOSIS — Z803 Family history of malignant neoplasm of breast: Secondary | ICD-10-CM

## 2021-03-22 DIAGNOSIS — Z806 Family history of leukemia: Secondary | ICD-10-CM

## 2021-03-22 NOTE — Telephone Encounter (Signed)
Revealed negative genetic testing.  This normal result is reassuring and indicates that it is unlikely Adam Gaines's cancer is due to a hereditary cause.  It is unlikely that there is an increased risk of another cancer due to a mutation in one of these genes.  However, genetic testing is not perfect, and cannot definitively rule out a hereditary cause.  It will be important for him to keep in contact with genetics to learn if any additional testing may be needed in the future.

## 2021-03-22 NOTE — Progress Notes (Addendum)
HPI:   Mr. Maysonet was previously seen in the [facility] due to a personal and family history of cancer and concerns regarding a hereditary predisposition to cancer. Please refer to our prior cancer genetics clinic note for more information regarding our discussion, assessment and recommendations, at the time. Mr. Stanco recent genetic test results were disclosed to him, as were recommendations warranted by these results. These results and recommendations are discussed in more detail below.  CANCER HISTORY:  Oncology History   No history exists.    FAMILY HISTORY:  We obtained a detailed, 4-generation family history.  Significant diagnoses are listed below: Family History  Problem Relation Age of Onset   Heart attack Mother 71       after aspiration   Coronary artery disease Mother    Heart disease Mother    COPD Mother        smoker   Coronary artery disease Father    Heart disease Father    Hyperlipidemia Father    Hypertension Father    Heart attack Father 34       d. 21   Healthy Brother    Healthy Brother    Prostate cancer Brother 20   Diabetes Maternal Grandmother    Cancer Maternal Grandmother        unknown/lived to age 49; stomach cancer   Diabetes Paternal Grandmother    Breast cancer Paternal Grandmother        d. 54s   Healthy Daughter    Healthy Daughter    Other Other        cardiovascular disease   Colon cancer Neg Hx     Mr. Calvey has 2 daughters, ages 52 and 40, no cancers. He has 2 brothers. One was recently diagnosed with prostate cancer at 4 and has not had genetic testing, he is being treated in [place].   Mr. Schwer mother died of heart issues at 64. Patient had 1 maternal uncle and he died suddenly of leukemia at 37. Maternal grandmother had possible stomach cancer diagnosed at 29 and died at 80. Grandfather had a benign brain tumor, he passed at 47.   Mr. Korinek father died at 35 of a heart attack. Patient had 2  paternal aunts, no cancer for them or for their children. Paternal grandfather died of kidney failure early in life. Grandmother had breast cancer and died in her 49s.   Mr. Farrington is unaware of previous family history of genetic testing for hereditary cancer risks. Patient's maternal ancestors are of Ashkenazi Jewish descent, and paternal ancestors are of [place] Jewish descent. There is no known consanguinity.     GENETIC TEST RESULTS:  The [facility] Multi-Cancer+Leukemia Panel found no pathogenic mutations.   The Multi-Cancer Panel + Leukemia panel offered by [facility] includes sequencing and/or deletion duplication testing of the following 101 genes: AIP, ALK, ANKRD26*, APC*, ATM*, AXIN2, BAP1, BARD1, BLM, BMPR1A, BRCA1, BRCA2, BRIP1, CASR, CBL, CDC73, CDH1, CDK4, CDKN1B, CDKN1C, CDKN2A (p14ARF), CDKN2A (p16INK4a), CEBPA, CHEK2, CTNNA1, DDX41, DICER1*, DIS3L2*, EGFR, ELANE, EPCAM*, ERCC6L2, ETV6, FH*, FLCN, G6PC3, GATA2, GFI1*, GPC3*, GREM1*, HAX1, HOXB13, HRAS, IKZF1, KIT, KRAS, MAX*, MECOM, MEN1*, MET*, MITF, MLH1*, MSH2*, MSH3*, MSH6*, MUTYH, NBN, NF1*, NF2, NTHL1, PALB2, PDGFRA, PHOX2B*, PMS2*, POLD1*, POLE, POT1, PRKAR1A, PTCH1, PTEN*, PTPN11, RAD50, RAD51C, RAD51D, RB1*, RECQL4*, RET, RTEL1, RUNX1, SAMD9, SAMD9L, SDHA*, SDHAF2, SDHB, SDHC*, SDHD, SMAD4, SMARCA4, SMARCB1, SMARCE1, SRP72, STK11, SUFU, TERC, TERT, TMEM127, TP53, TSC1*, TSC2, VHL, WRN*, WT1.  The test report has  been scanned into EPIC and is located under the Molecular Pathology section of the Results Review tab.  A portion of the result report is included below for reference. Genetic testing reported out on 03/21/2021.      Even though a pathogenic variant was not identified, possible explanations for the cancer in the family may include: There may be no hereditary risk for cancer in the family. The cancers in Mr. Murch and/or his family may be due to other genetic or environmental factors. There may be a gene mutation in one of  these genes that current testing methods cannot detect, but that chance is small. There could be another gene that has not yet been discovered, or that we have not yet tested, that is responsible for the cancer diagnoses in the family.  It is also possible there is a hereditary cause for the cancer in the family that Mr. Cottingham did not inherit. Therefore, it is important to remain in touch with cancer genetics in the future so that we can continue to offer Mr. Hausmann the most up to date genetic testing.   ADDITIONAL GENETIC TESTING:  We discussed with Mr. Vera that his genetic testing was fairly extensive.  If there are genes identified to increase cancer risk that can be analyzed in the future, we would be happy to discuss and coordinate this testing at that time.    CANCER SCREENING RECOMMENDATIONS:  Mr. Keyworth test result is considered negative (normal).  This means that we have not identified a hereditary cause for his personal and family history of cancer at this time. Most cancers happen by chance and this negative test suggests that his cancer may fall into this category.    An individual's cancer risk and medical management are not determined by genetic test results alone. Overall cancer risk assessment incorporates additional factors, including personal medical history, family history, and any available genetic information that may result in a personalized plan for cancer prevention and surveillance. Therefore, it is recommended he continue to follow the cancer management and screening guidelines provided by his oncology and primary healthcare provider.  RECOMMENDATIONS FOR FAMILY MEMBERS:   Since he did not inherit a mutation in a cancer predisposition gene included on this panel, his children could not have inherited a mutation from him in one of these genes. Individuals in this family might be at some increased risk of developing cancer, over the general population risk, due to the  family history of cancer. We recommend women in this family have a yearly mammogram beginning at age 89, or 2 years younger than the earliest onset of cancer, an annual clinical breast exam, and perform monthly breast self-exams.  Other members of the family may still carry a pathogenic variant in one of these genes that Mr. Ozment did not inherit. Based on the family history, we recommend his paternal relatives, given his grandmother's breast cancer in her 69s, have genetic counseling and testing. Mr. Spieker will let us know if we can be of any assistance in coordinating genetic counseling and/or testing for these family members.  FOLLOW-UP:  Lastly, we discussed with Mr. Sutch that cancer genetics is a rapidly advancing field and it is possible that new genetic tests will be appropriate for him and/or his family members in the future. We encouraged him to remain in contact with cancer genetics on an annual basis so we can update his personal and family histories and let him know of advances in cancer genetics that may  benefit this family.   Our contact number was provided. Mr. Mcniel questions were answered to his satisfaction, and he knows he is welcome to call us at anytime with additional questions or concerns.    Faith Rogue, MS, [facility] Genetic Counselor [email or web address] Phone: [phone]

## 2021-05-08 ENCOUNTER — Encounter: Payer: Self-pay | Admitting: Internal Medicine

## 2021-06-21 ENCOUNTER — Telehealth: Payer: Self-pay | Admitting: *Deleted

## 2021-06-21 NOTE — Telephone Encounter (Signed)
Yes. Direct colonoscopy in [place] for history of colon polyps. Thanks

## 2021-06-21 NOTE — Telephone Encounter (Signed)
Noted! Thank you

## 2021-06-21 NOTE — Telephone Encounter (Signed)
Dr.Perry,  This patient is 76 years old scheduled for a recall colon, last colon 07/19/2016 (TA polyps) recall 5 years. I did not see a recall assessment sheet in chart. Patient has hx of prostate cancer 2019. Okay to proceed with [facility] recall colon as scheduled? Please advise. Thank you,Cree Kunert pv

## 2021-06-27 ENCOUNTER — Telehealth: Payer: Self-pay | Admitting: Internal Medicine

## 2021-06-27 DIAGNOSIS — E785 Hyperlipidemia, unspecified: Secondary | ICD-10-CM

## 2021-06-27 DIAGNOSIS — I5189 Other ill-defined heart diseases: Secondary | ICD-10-CM

## 2021-06-27 DIAGNOSIS — Z79899 Other long term (current) drug therapy: Secondary | ICD-10-CM

## 2021-06-27 DIAGNOSIS — I351 Nonrheumatic aortic (valve) insufficiency: Secondary | ICD-10-CM

## 2021-06-27 NOTE — Telephone Encounter (Signed)
New Message:      Patient would like to have lab work the  week before he see Dr Harrington Challenger on 10-19-21 Please let me know when order is put in so I can schedule patient's lab appointment.

## 2021-06-28 NOTE — Telephone Encounter (Signed)
Fasting labs prior:   CBC, BMET, lipid panel

## 2021-06-29 NOTE — Telephone Encounter (Addendum)
Spoke with the pts wife/ Katharine Look on [place].. fasting labs 10/11/21.

## 2021-07-02 NOTE — Telephone Encounter (Signed)
Bmet changed to CMET per Dr. Harrington Challenger.

## 2021-07-02 NOTE — Addendum Note (Signed)
Addended by: Stephani Police on: 07/02/2021 12:50 PM   Modules accepted: Orders

## 2021-07-05 ENCOUNTER — Ambulatory Visit: Payer: BC Managed Care – PPO | Admitting: *Deleted

## 2021-07-05 ENCOUNTER — Other Ambulatory Visit: Payer: Self-pay

## 2021-07-05 VITALS — Ht 67.0 in | Wt 152.0 lb

## 2021-07-05 DIAGNOSIS — Z86010 Personal history of colonic polyps: Secondary | ICD-10-CM

## 2021-07-05 MED ORDER — NA SULFATE-K SULFATE-MG SULF 17.5-3.13-1.6 GM/177ML PO SOLN: 1.0000 | Freq: Once | ORAL | 0 refills | Status: AC

## 2021-07-05 NOTE — Progress Notes (Signed)

## 2021-07-17 ENCOUNTER — Encounter: Payer: Self-pay | Admitting: Internal Medicine

## 2021-07-19 ENCOUNTER — Other Ambulatory Visit: Payer: Self-pay

## 2021-07-19 ENCOUNTER — Encounter: Payer: Self-pay | Admitting: Internal Medicine

## 2021-07-19 ENCOUNTER — Ambulatory Visit (AMBULATORY_SURGERY_CENTER): Payer: BC Managed Care – PPO | Admitting: Internal Medicine

## 2021-07-19 VITALS — BP 167/66 | HR 61 | Temp 98.7°F | Resp 10 | Ht 67.0 in | Wt 152.0 lb

## 2021-07-19 DIAGNOSIS — D125 Benign neoplasm of sigmoid colon: Secondary | ICD-10-CM

## 2021-07-19 DIAGNOSIS — Z86010 Personal history of colonic polyps: Secondary | ICD-10-CM

## 2021-07-19 DIAGNOSIS — D122 Benign neoplasm of ascending colon: Secondary | ICD-10-CM | POA: Diagnosis not present

## 2021-07-19 MED ORDER — SODIUM CHLORIDE 0.9 % IV SOLN
4.0000 mg | Freq: Once | INTRAVENOUS | Status: AC
  Administered 2021-07-19: 4 mg via INTRAVENOUS

## 2021-07-19 MED ORDER — SODIUM CHLORIDE 0.9 % IV SOLN: 500.0000 mL | Freq: Once | INTRAVENOUS | Status: DC

## 2021-07-19 NOTE — Patient Instructions (Signed)

## 2021-07-19 NOTE — Progress Notes (Signed)
Pt's states no medical or surgical changes since previsit or office visit.  VS CW  

## 2021-07-19 NOTE — Progress Notes (Signed)
Called to room to assist during endoscopic procedure.  Patient ID and intended procedure confirmed with present staff. Received instructions for my participation in the procedure from the performing physician.  

## 2021-07-19 NOTE — Op Note (Signed)
[facility] Patient Name: Adam Gaines Procedure Date: 07/19/2021 8:01 AM MRN: [id number] Endoscopist: Wilhemina Bonito. Marina Goodell , MD Age: 76 Referring MD:  Date of Birth: [birth year] Gender: Male Account #: [id number] Procedure:                Colonoscopy with cold snare polypectomy x 2 Indications:              High risk colon cancer surveillance: Personal                            history of multiple (3 or more) adenomas. Previous                            examinations 1997, 2002, 2008, 2013, 2018 Medicines:                Monitored Anesthesia Care Procedure:                Pre-Anesthesia Assessment:                           - Prior to the procedure, a History and Physical                            was performed, and patient medications and                            allergies were reviewed. The patient's tolerance of                            previous anesthesia was also reviewed. The risks                            and benefits of the procedure and the sedation                            options and risks were discussed with the patient.                            All questions were answered, and informed consent                            was obtained. Prior Anticoagulants: The patient has                            taken no previous anticoagulant or antiplatelet                            agents. ASA Grade Assessment: II - A patient with                            mild systemic disease. After reviewing the risks                            and benefits, the patient was deemed in  satisfactory condition to undergo the procedure.                           After obtaining informed consent, the colonoscope                            was passed under direct vision. Throughout the                            procedure, the patient's blood pressure, pulse, and                            oxygen saturations were monitored continuously. The                             Olympus CF-HQ190L (Serial# [id number]) Colonoscope was                            introduced through the anus and advanced to the the                            cecum, identified by appendiceal orifice and                            ileocecal valve. The ileocecal valve, appendiceal                            orifice, and rectum were photographed. The quality                            of the bowel preparation was excellent. The                            colonoscopy was performed without difficulty. The                            patient tolerated the procedure well. The bowel                            preparation used was SUPREP via split dose                            instruction. Scope In: 8:16:52 AM Scope Out: 8:36:24 AM Scope Withdrawal Time: 0 hours 14 minutes 11 seconds  Total Procedure Duration: 0 hours 19 minutes 32 seconds  Findings:                 Two polyps were found in the sigmoid colon and                            ascending colon. The polyps were 1 to 6 mm in size.                            These polyps were removed with a cold snare.  Resection and retrieval were complete.                           A few diverticula were found in the sigmoid colon.                           Internal hemorrhoids were found during                            retroflexion. The hemorrhoids were small.                           The exam was otherwise without abnormality on                            direct and retroflexion views. Complications:            No immediate complications. Estimated blood loss:                            None. Estimated Blood Loss:     Estimated blood loss: none. Impression:               - Two 1 to 6 mm polyps in the sigmoid colon and in                            the ascending colon, removed with a cold snare.                            Resected and retrieved.                           - Diverticulosis in the sigmoid colon.                            - Internal hemorrhoids.                           - The examination was otherwise normal on direct                            and retroflexion views. Recommendation:           - Repeat colonoscopy in 5 years for surveillance.                           - Patient has a contact number available for                            emergencies. The signs and symptoms of potential                            delayed complications were discussed with the                            patient. Return to normal activities tomorrow.  Written discharge instructions were provided to the                            patient.                           - Resume previous diet.                           - Continue present medications.                           - Await pathology results. Wilhemina Bonito. Marina Goodell, MD 07/19/2021 8:47:07 AM This report has been signed electronically.

## 2021-07-19 NOTE — Progress Notes (Signed)
PT taken to PACU. Monitors in place. VSS. Report given to RN. 

## 2021-07-19 NOTE — Progress Notes (Signed)
HISTORY OF PRESENT ILLNESS:  Adam Gaines is a 76 y.o. male with a history of multiple adenomatous colon polyps who presents today for surveillance colonoscopy.  No GI complaints.  Tolerated prep.  REVIEW OF SYSTEMS:  All non-GI ROS negative. Past Medical History:  Diagnosis Date   AR (aortic regurgitation) 06/28/2016   Mild, noted on ECHO    Benign essential tremor 11/18/2011   Seen for this November 18, 2011    Bilateral cataracts    Cardiovascular risk factor 03/08/2012   Cervical spondylosis 12/18/2017   noted on CT   Chickenpox    Chronic eczema    of the plantar aspect right foot   Colon polyps    Tubular Adenoma 2008, and polyps 2018   Diverticulosis    Diverticulosis of sigmoid colon 07/19/2016   noted on colonoscopy   DOE (dyspnea on exertion)    Family history of breast cancer    Family history of leukemia    Family history of prostate cancer    Grade II diastolic dysfunction 71/24/5809   noted on ECHO    Internal hemorrhoids 07/19/2016   noted on colonoscopy   Intestinal angiodysplasia    Lumbago    LVH (left ventricular hypertrophy) 06/28/2016   Mild, noted on ECHO    Other and unspecified hyperlipidemia    Personal history of other disorder of urinary system    Prostate cancer ([place])    Seasonal allergies    TONSILLECTOMY AND ADENOIDECTOMY, HX OF 07/24/2007   Qualifier: Diagnosis of  By: Danny Lawless CMA, [place]     Vitamin B12 deficiency    Vitamin D deficiency     Past Surgical History:  Procedure Laterality Date   CATARACT EXTRACTION, BILATERAL     Left 06/25/2017, Right 07/09/2017   COLONOSCOPY     COLONOSCOPY W/ POLYPECTOMY  07/19/2016   left L5 hemilaminotomy with microdiskectomy     POLYPECTOMY     RADIOACTIVE SEED IMPLANT N/A 03/11/2018   Procedure: RADIOACTIVE SEED IMPLANT/BRACHYTHERAPY IMPLANT;  Surgeon: Ardis Hughs, MD;  Location: [facility];  Service: Urology;  Laterality: N/A;   repair of deviated septum     SPACE OAR  INSTILLATION N/A 03/11/2018   Procedure: SPACE OAR INSTILLATION;  Surgeon: Ardis Hughs, MD;  Location: [facility];  Service: Urology;  Laterality: N/A;   TONSILLECTOMY      Social History [name]  reports that he quit smoking about 42 years ago. His smoking use included cigarettes. He has never used smokeless tobacco. He reports current alcohol use of about 5.0 standard drinks per week. He reports that he does not use drugs.  family history includes Breast cancer in his paternal grandmother; COPD in his mother; Cancer in his maternal grandmother; Coronary artery disease in his father and mother; Diabetes in his maternal grandmother and paternal grandmother; Healthy in his brother, brother, daughter, and daughter; Heart attack (age of onset: 53) in his father; Heart attack (age of onset: 74) in his mother; Heart disease in his father and mother; Hyperlipidemia in his father; Hypertension in his father; Other in an other family member; Prostate cancer (age of onset: 55) in his brother.  No Known Allergies     PHYSICAL EXAMINATION:  Vital signs: BP (!) 161/101    Pulse 64    Temp 98.7 F (37.1 C) (Temporal)    Resp (!) 23    Ht 5\' 7"  (1.702 m)    Wt 152 lb (68.9 kg)  SpO2 100%    BMI 23.81 kg/m  General: Well-developed, well-nourished, no acute distress HEENT: Sclerae are anicteric, conjunctiva pink. Oral mucosa intact Lungs: Clear Heart: Regular Abdomen: soft, nontender, nondistended, no obvious ascites, no peritoneal signs, normal bowel sounds. No organomegaly. Extremities: No edema Psychiatric: alert and oriented x3. Cooperative     ASSESSMENT:  History multiple adenomatous colon polyps   PLAN:   Surveillance colonoscopy

## 2021-07-24 ENCOUNTER — Telehealth: Payer: Self-pay | Admitting: *Deleted

## 2021-07-24 ENCOUNTER — Encounter: Payer: Self-pay | Admitting: Internal Medicine

## 2021-07-24 NOTE — Telephone Encounter (Signed)
°  Follow up Call-  Call back number 07/19/2021  Post procedure Call Back phone  # [phone]  Permission to leave phone message Yes  Some recent data might be hidden     Patient questions:  Do you have a fever, pain , or abdominal swelling? No. Pain Score  0 *  Have you tolerated food without any problems? Yes.    Have you been able to return to your normal activities? Yes.    Do you have any questions about your discharge instructions: Diet   No. Medications  No. Follow up visit  No.  Do you have questions or concerns about your Care? No.  Actions: * If pain score is 4 or above: No action needed, pain <4.

## 2021-08-16 ENCOUNTER — Encounter: Payer: Self-pay | Admitting: Internal Medicine

## 2021-08-16 MED ORDER — ATORVASTATIN CALCIUM 20 MG PO TABS: 20.0000 mg | ORAL_TABLET | Freq: Every day | ORAL | 0 refills | Status: DC

## 2021-09-26 ENCOUNTER — Ambulatory Visit: Payer: BC Managed Care – PPO | Admitting: Internal Medicine

## 2021-10-11 ENCOUNTER — Other Ambulatory Visit: Payer: BC Managed Care – PPO

## 2021-10-11 DIAGNOSIS — E785 Hyperlipidemia, unspecified: Secondary | ICD-10-CM

## 2021-10-11 DIAGNOSIS — I5189 Other ill-defined heart diseases: Secondary | ICD-10-CM

## 2021-10-11 DIAGNOSIS — I351 Nonrheumatic aortic (valve) insufficiency: Secondary | ICD-10-CM

## 2021-10-11 DIAGNOSIS — Z79899 Other long term (current) drug therapy: Secondary | ICD-10-CM

## 2021-10-11 LAB — COMPREHENSIVE METABOLIC PANEL
ALT: 33 IU/L (ref 0–44)
AST: 30 IU/L (ref 0–40)
Albumin/Globulin Ratio: 2.3 — ABNORMAL HIGH (ref 1.2–2.2)
Albumin: 4.2 g/dL (ref 3.7–4.7)
Alkaline Phosphatase: 39 IU/L — ABNORMAL LOW (ref 44–121)
BUN/Creatinine Ratio: 16 (ref 10–24)
BUN: 15 mg/dL (ref 8–27)
Bilirubin Total: 0.3 mg/dL (ref 0.0–1.2)
CO2: 27 mmol/L (ref 20–29)
Calcium: 9.7 mg/dL (ref 8.6–10.2)
Chloride: 102 mmol/L (ref 96–106)
Creatinine, Ser: 0.91 mg/dL (ref 0.76–1.27)
Globulin, Total: 1.8 g/dL (ref 1.5–4.5)
Glucose: 94 mg/dL (ref 70–99)
Potassium: 5.3 mmol/L — ABNORMAL HIGH (ref 3.5–5.2)
Sodium: 141 mmol/L (ref 134–144)
Total Protein: 6 g/dL (ref 6.0–8.5)
eGFR: 86 mL/min/{1.73_m2} (ref >59)

## 2021-10-11 LAB — LIPID PANEL
Chol/HDL Ratio: 2 ratio (ref 0.0–5.0)
Cholesterol, Total: 203 mg/dL — ABNORMAL HIGH (ref 100–199)
HDL: 101 mg/dL (ref >39)
LDL Chol Calc (NIH): 95 mg/dL (ref 0–99)
Triglycerides: 34 mg/dL (ref 0–149)
VLDL Cholesterol Cal: 7 mg/dL (ref 5–40)

## 2021-10-11 LAB — CBC
Hematocrit: 40.1 % (ref 37.5–51.0)
Hemoglobin: 13.5 g/dL (ref 13.0–17.7)
MCH: 31.8 pg (ref 26.6–33.0)
MCHC: 33.7 g/dL (ref 31.5–35.7)
MCV: 94 fL (ref 79–97)
Platelets: 196 10*3/uL (ref 150–450)
RBC: 4.25 x10E6/uL (ref 4.14–5.80)
RDW: 12.8 % (ref 11.6–15.4)
WBC: 4.7 10*3/uL (ref 3.4–10.8)

## 2021-10-12 ENCOUNTER — Other Ambulatory Visit: Payer: BC Managed Care – PPO

## 2021-10-16 ENCOUNTER — Telehealth: Payer: Self-pay

## 2021-10-16 NOTE — Telephone Encounter (Signed)
I spoke with the pt re: his K and he says he has been taking Tumeric but he thinks it may have been elevated due to taking Celebrex and a 10 day steroid dose pack for his back pain. He is now off of the steroid and the Celebrex. He will not stop anything else since he had been using tumeric for years and will have it rechecked. He has an upcoming appt with Dr Harrington Challenger to further discuss this Friday 10/19/21.  ?

## 2021-10-16 NOTE — Telephone Encounter (Signed)
-----   Message from Fay Records, MD sent at 10/12/2021  6:20 PM EDT ----- ?CBC is normal  ?Lipdis overall good  LDL 95  HDL 101 ?Electrolytes aand kidney function are OK  Potassium is minimally elevated  Only time it has been increased  Stay hydrated    Watch for high K foods   (salt substitutes, avoid lots of bananas and OJ) ?REpeat BMET in  a few wks     ?

## 2021-10-18 NOTE — Progress Notes (Signed)
Cardiology Office Note   Date:  10/19/2021   ID:  [name], DOB 14-Oct-1945, MRN [id number]  PCP:  Eartha Inch, MD  Cardiologist:   Dietrich Pates, MD   F/U of dyslipidemia     History of Present Illness: Adam Gaines is a 76 y.o. male with a history of prostate cancer,  hyperlipidemia and dyspnea He also has a FHx of CAD  Calcum score CT in 2013 was 0 Echocardiogram showed preserved LVEF with grade 2 diastolic dysfunction and mild aortic regurgitation. I saw the pt in Jan 2022  Since seen he has done well   Denies CP  Breathing is OK   No dizziness    Walking 3x per week     Diet:  Breakfast;  Gr yogurt, high fiber cereal, fruit Lunch  Tuna fish salad or chicken salad Dinner   Chicken or salmon  Drinks  Public relations account executive  Current Meds  Medication Sig   Ascorbic Acid (VITAMIN C PO) Take 500 mg by mouth daily.    atorvastatin (LIPITOR) 20 MG tablet Take 1 tablet (20 mg total) by mouth daily.   b complex vitamins tablet Take 1 tablet by mouth daily.   celecoxib (CELEBREX) 200 MG capsule Take 200 mg by mouth as needed.   Cholecalciferol (VITAMIN D) 2000 UNITS tablet Take 1 tablet (2,000 Units total) by mouth daily. (Patient taking differently: Take 5,000 Units by mouth daily.)   [employer] TRIPLE MAGNESIUM COMPLEX PO Take by mouth daily.   mirabegron ER (MYRBETRIQ) 50 MG TB24 tablet Take 50 mg by mouth daily.   Probiotic Product (PROBIOTIC-10 PO) Take by mouth daily.   tamsulosin (FLOMAX) 0.4 MG CAPS capsule Take 0.4 mg by mouth. Take 1 tablets daily   TURMERIC PO Take 1 capsule by mouth daily.   Vibegron (GEMTESA) 75 MG TABS Take by mouth daily. Take one daily     Allergies:   Patient has no known allergies.   Past Medical History:  Diagnosis Date   AR (aortic regurgitation) 06/28/2016   Mild, noted on ECHO    Benign essential tremor 11/18/2011   Seen for this November 18, 2011    Bilateral cataracts    Cardiovascular risk factor 03/08/2012   Cervical spondylosis  12/18/2017   noted on CT   Chickenpox    Chronic eczema    of the plantar aspect right foot   Colon polyps    Tubular Adenoma 2008, and polyps 2018   Diverticulosis    Diverticulosis of sigmoid colon 07/19/2016   noted on colonoscopy   DOE (dyspnea on exertion)    Family history of breast cancer    Family history of leukemia    Family history of prostate cancer    Grade II diastolic dysfunction 06/28/2016   noted on ECHO    Internal hemorrhoids 07/19/2016   noted on colonoscopy   Intestinal angiodysplasia    Lumbago    LVH (left ventricular hypertrophy) 06/28/2016   Mild, noted on ECHO    Other and unspecified hyperlipidemia    Personal history of other disorder of urinary system    Prostate cancer (HCC)    Seasonal allergies    TONSILLECTOMY AND ADENOIDECTOMY, HX OF 07/24/2007   Qualifier: Diagnosis of  By: Genelle Gather CMA, [place]     Vitamin B12 deficiency    Vitamin D deficiency     Past Surgical History:  Procedure Laterality Date   CATARACT EXTRACTION, BILATERAL     Left 06/25/2017, Right 07/09/2017  COLONOSCOPY     COLONOSCOPY W/ POLYPECTOMY  07/19/2016   left L5 hemilaminotomy with microdiskectomy     POLYPECTOMY     RADIOACTIVE SEED IMPLANT N/A 03/11/2018   Procedure: RADIOACTIVE SEED IMPLANT/BRACHYTHERAPY IMPLANT;  Surgeon: Crist Fat, MD;  Location[facility];  Service: Urology;  Laterality: N/A;   repair of deviated septum     SPACE OAR INSTILLATION N/A 03/11/2018   Procedure: SPACE OAR INSTILLATION;  Surgeon: Crist Fat, MD;  Location: [facility];  Service: Urology;  Laterality: N/A;   TONSILLECTOMY       Social History:  The patient  reports that he quit smoking about 42 years ago. His smoking use included cigarettes. He has never used smokeless tobacco. He reports current alcohol use of about 5.0 standard drinks per week. He reports that he does not use drugs.   Family History:  The patient's family history  includes Breast cancer in his paternal grandmother; COPD in his mother; Cancer in his maternal grandmother; Coronary artery disease in his father and mother; Diabetes in his maternal grandmother and paternal grandmother; Healthy in his brother, brother, daughter, and daughter; Heart attack (age of onset: 4) in his father; Heart attack (age of onset: 71) in his mother; Heart disease in his father and mother; Hyperlipidemia in his father; Hypertension in his father; Other in an other family member; Prostate cancer (age of onset: 78) in his brother.    ROS:  Please see the history of present illness. All other systems are reviewed and  Negative to the above problem except as noted.    PHYSICAL EXAM: VS:  BP 112/64   Pulse 65   Ht 5\' 7"  (1.702 m)   Wt 154 lb 12.8 oz (70.2 kg)   SpO2 95%   BMI 24.25 kg/m   GEN: Well nourished, well developed, in no acute distress  HEENT: normal  Neck: JVP is normal.  No carotid bruits Cardiac: RRR; no murmurs,  No  LE  edema  Respiratory:  clear to auscultation bilaterally,  GI: soft, nontender, nondistended, + BS  No hepatomegaly  MS: no deformity Moving all extremities   Skin: warm and dry, no rash Neuro:  Strength and sensation are intact Psych: euthymic mood, full affect   EKG:  EKG is ordered today.SR 65 bpm     Lipid Panel    Component Value Date/Time   CHOL 203 (H) 10/11/2021 0729   CHOL 203 (H) 05/09/2014 0733   TRIG 34 10/11/2021 0729   TRIG 106 05/09/2014 0733   HDL 101 10/11/2021 0729   HDL 85 05/09/2014 0733   CHOLHDL 2.0 10/11/2021 0729   CHOLHDL 2 04/30/2017 0724   VLDL 15.2 04/30/2017 0724   LDLCALC 95 10/11/2021 0729   LDLCALC 97 05/09/2014 0733      Wt Readings from Last 3 Encounters:  10/19/21 154 lb 12.8 oz (70.2 kg)  07/19/21 152 lb (68.9 kg)  07/05/21 152 lb (68.9 kg)      ASSESSMENT AND PLAN:  1 HL    Lipids done a few days ago   LDL 95  HDL 101     Discussed diet   Continue to watch carbs His LDL was lower in  past  (76 a couple years ago)     I would keep on lipitor for now at current dose   Follow up with lipomed in 1 year   2  Hx AI   Mild on echo in 2018  again no murmurs   3 Calcium score    CT calcium score 0 in 2013      Stay active        Otherwise F/U in 1 year    Current medicines are reviewed at length with the patient today.  The patient does not have concerns regarding medicines.  Signed, Dietrich Pates, MD  10/19/2021 8:17 AM    [facility] [address] [place], [place], [place]  [address] Phone: [phone]; Fax: [phone]

## 2021-10-19 ENCOUNTER — Ambulatory Visit: Payer: BC Managed Care – PPO | Admitting: Internal Medicine

## 2021-10-19 ENCOUNTER — Encounter: Payer: Self-pay | Admitting: Internal Medicine

## 2021-10-19 ENCOUNTER — Other Ambulatory Visit: Payer: Self-pay

## 2021-10-19 VITALS — BP 112/64 | HR 65 | Ht 67.0 in | Wt 154.8 lb

## 2021-10-19 DIAGNOSIS — Z79899 Other long term (current) drug therapy: Secondary | ICD-10-CM

## 2021-10-19 DIAGNOSIS — I5189 Other ill-defined heart diseases: Secondary | ICD-10-CM

## 2021-10-19 DIAGNOSIS — E876 Hypokalemia: Secondary | ICD-10-CM

## 2021-10-19 MED ORDER — ATORVASTATIN CALCIUM 20 MG PO TABS: 20.0000 mg | ORAL_TABLET | Freq: Every day | ORAL | 0 refills | Status: DC

## 2021-10-19 NOTE — Patient Instructions (Signed)
Medication Instructions:  ? ?*If you need a refill on your cardiac medications before your next appointment, please call your pharmacy* ? ? ?Lab Work: ? ?If you have labs (blood work) drawn today and your tests are completely normal, you will receive your results only by: ?MyChart Message (if you have MyChart) OR ?A paper copy in the mail ?If you have any lab test that is abnormal or we need to change your treatment, we will call you to review the results. ? ? ?Testing/Procedures: ? ? ? ?Follow-Up: ?At [facility], you and your health needs are our priority.  As part of our continuing mission to provide you with exceptional heart care, we have created designated Provider Care Teams.  These Care Teams include your primary Cardiologist (physician) and Advanced Practice Providers (APPs -  Physician Assistants and Nurse Practitioners) who all work together to provide you with the care you need, when you need it. ? ?We recommend signing up for the patient portal called "MyChart".  Sign up information is provided on this After Visit Summary.  MyChart is used to connect with patients for Virtual Visits (Telemedicine).  Patients are able to view lab/test results, encounter notes, upcoming appointments, etc.  Non-urgent messages can be sent to your provider as well.   ?To learn more about what you can do with MyChart, go to [email or web address].   ? ?Your next appointment:   ?1 year(s) ? ?The format for your next appointment:   ?In Person ? ?Provider:   ?Paula Ross, MD   ? ? ?Other Instructions ? ? ?Important Information About Sugar ? ? ? ? ?  ?

## 2021-11-12 ENCOUNTER — Other Ambulatory Visit: Payer: BC Managed Care – PPO

## 2021-11-12 DIAGNOSIS — E876 Hypokalemia: Secondary | ICD-10-CM

## 2021-11-12 DIAGNOSIS — Z79899 Other long term (current) drug therapy: Secondary | ICD-10-CM

## 2021-11-12 LAB — BASIC METABOLIC PANEL
BUN/Creatinine Ratio: 17 (ref 10–24)
BUN: 14 mg/dL (ref 8–27)
CO2: 25 mmol/L (ref 20–29)
Calcium: 9.7 mg/dL (ref 8.6–10.2)
Chloride: 102 mmol/L (ref 96–106)
Creatinine, Ser: 0.84 mg/dL (ref 0.76–1.27)
Glucose: 112 mg/dL — ABNORMAL HIGH (ref 70–99)
Potassium: 4.1 mmol/L (ref 3.5–5.2)
Sodium: 140 mmol/L (ref 134–144)
eGFR: 89 mL/min/{1.73_m2} (ref >59)

## 2022-02-04 ENCOUNTER — Other Ambulatory Visit: Payer: Self-pay | Admitting: Internal Medicine

## 2022-02-04 DIAGNOSIS — I5189 Other ill-defined heart diseases: Secondary | ICD-10-CM

## 2022-07-07 ENCOUNTER — Encounter: Payer: Self-pay | Admitting: Internal Medicine

## 2022-07-07 DIAGNOSIS — E876 Hypokalemia: Secondary | ICD-10-CM

## 2022-07-07 DIAGNOSIS — I5189 Other ill-defined heart diseases: Secondary | ICD-10-CM

## 2022-07-07 DIAGNOSIS — Z79899 Other long term (current) drug therapy: Secondary | ICD-10-CM

## 2022-07-07 DIAGNOSIS — E785 Hyperlipidemia, unspecified: Secondary | ICD-10-CM

## 2022-08-20 IMAGING — MR MR CERVICAL SPINE WO/W CM
4 of 7 series · 17 of 48 positions shown · IV contrast (gadavist)
Comparison: Cervical MRI 02/03/2015. CT neck 12/18/2017. Lumbar MRI
03/08/2020.

CLINICAL DATA: Intradural enhancement in the lumbar spine on
previous MRI. Evaluate for possible intradural metastases. No given
history of malignancy, but apparently there is a history of prostate
cancer.

EXAM:
MRI CERVICAL AND THORACIC SPINE WITHOUT AND WITH CONTRAST
TECHNIQUE: Multiplanar and multiecho pulse sequences of the cervical spine, to
include the craniocervical junction and cervicothoracic junction,
and the thoracic spine, were obtained without and with intravenous
contrast.
CONTRAST:  7mL GADAVIST GADOBUTROL 1 MMOL/ML IV SOLN

[Series 11: T2 · sagittal · 3.0mm · 0.43mm/px · 4 of 16 slices shown (1 of 2)]
[im 1/16]
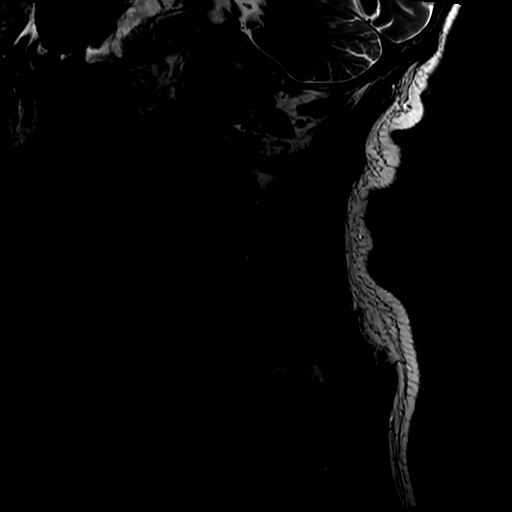
[im 6/16]
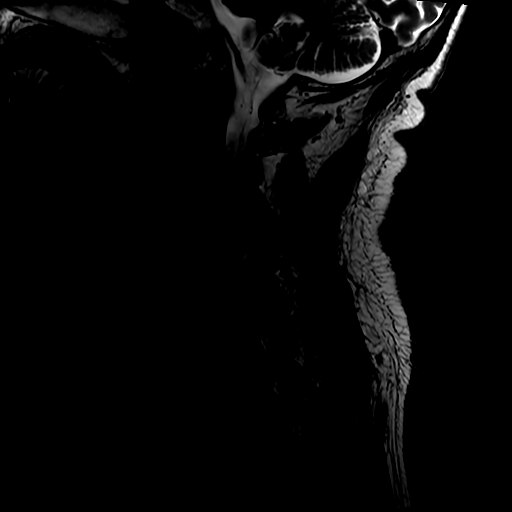
[im 11/16]
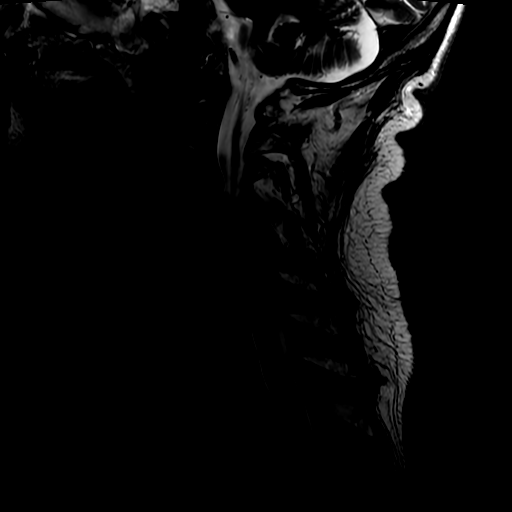
[im 16/16]
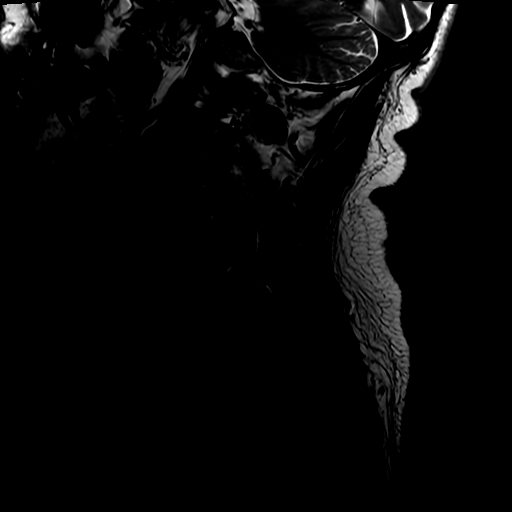

[Series 13: STIR · sagittal · 3.0mm · 0.43mm/px · 3 of 16 slices shown]
[im 1/16]
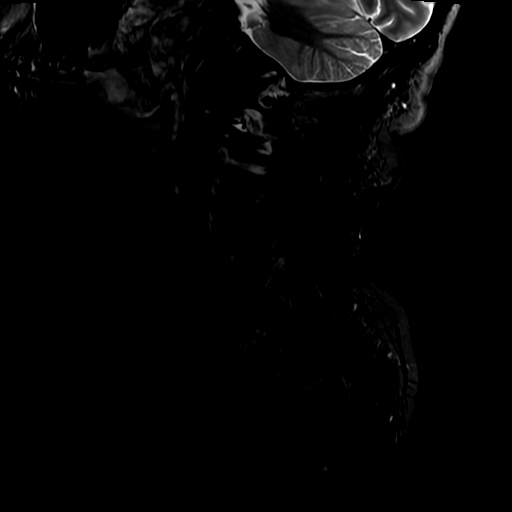
[im 8/16]
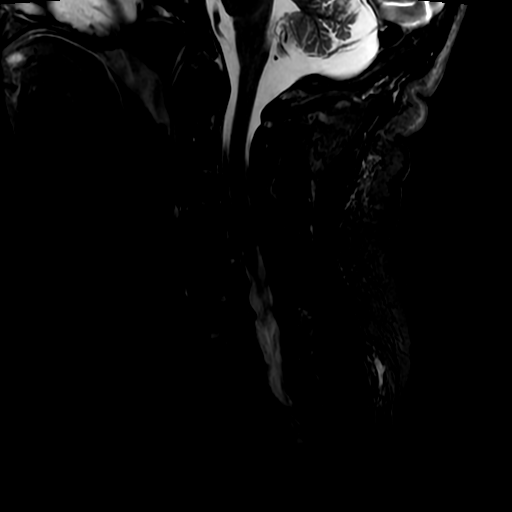
[im 16/16]
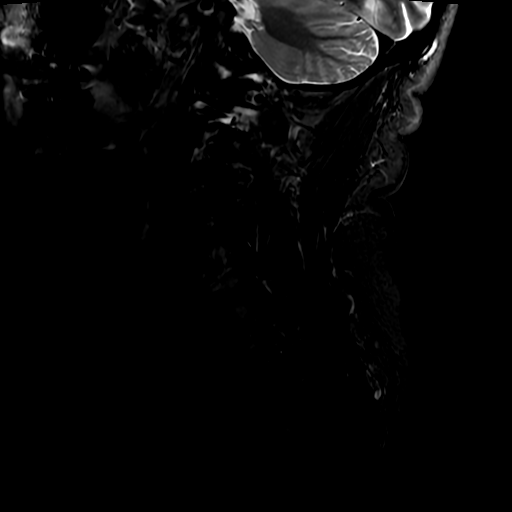

[Series 15: T2 · axial · 3.0mm · 0.35mm/px · z∈[-196,-91]mm · 7 of 33 slices shown (2 of 2)]
[im 1/33]
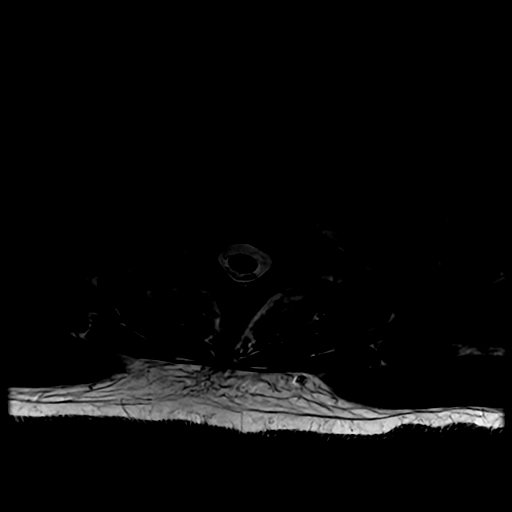
[im 6/33]
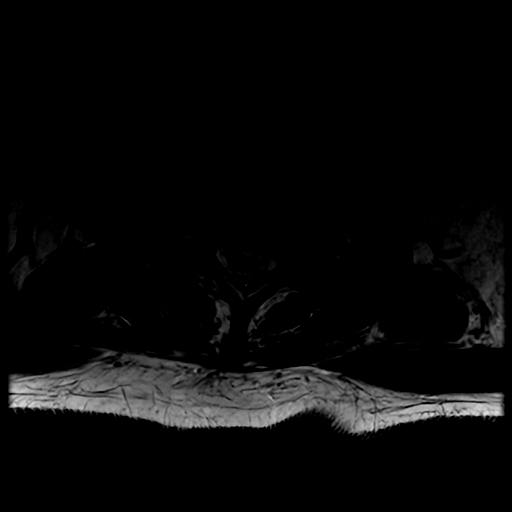
[im 11/33]
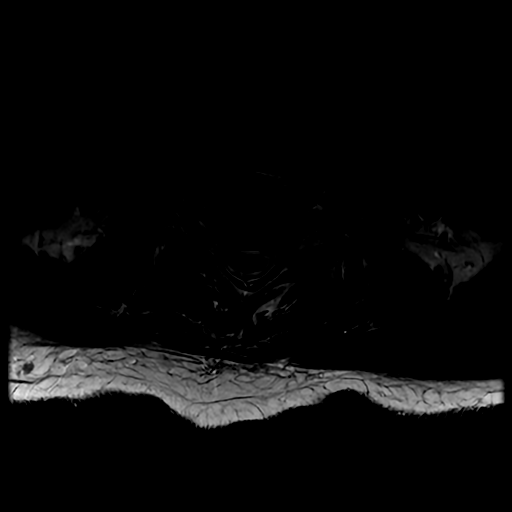
[im 17/33]
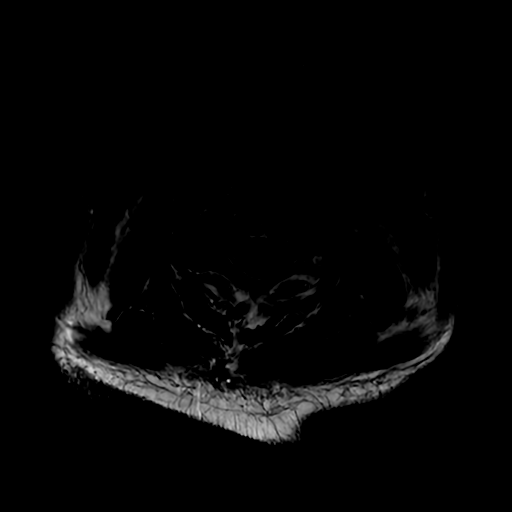
[im 22/33]
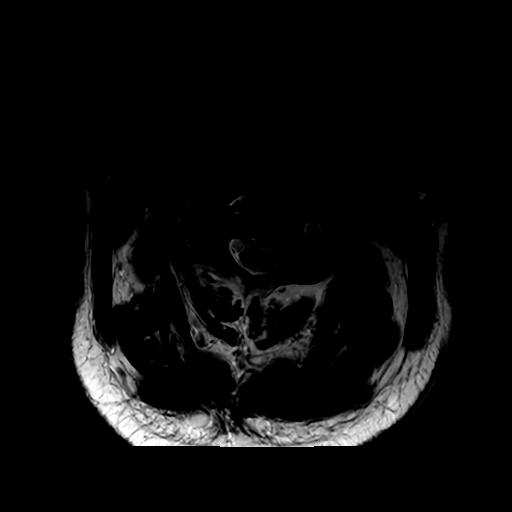
[im 27/33]
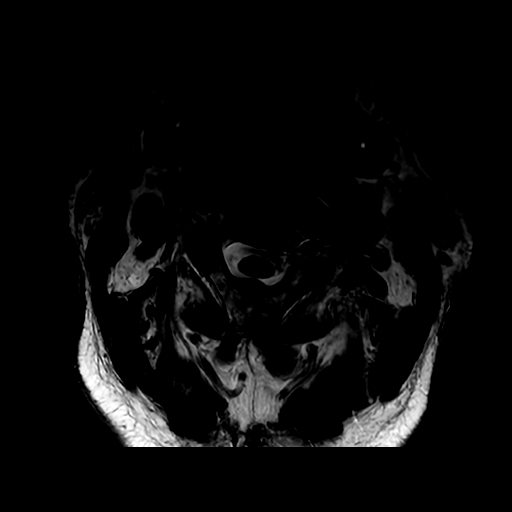
[im 33/33]
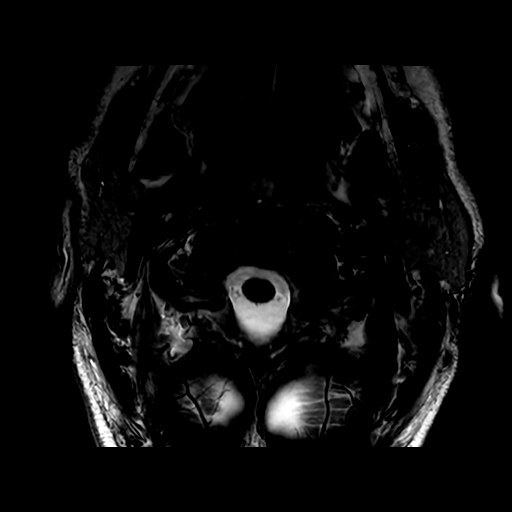

[Series 26: T1 fat-sat post-contrast · sagittal · 3.0mm · 0.43mm/px · 3 of 16 slices shown]
[im 1/16]
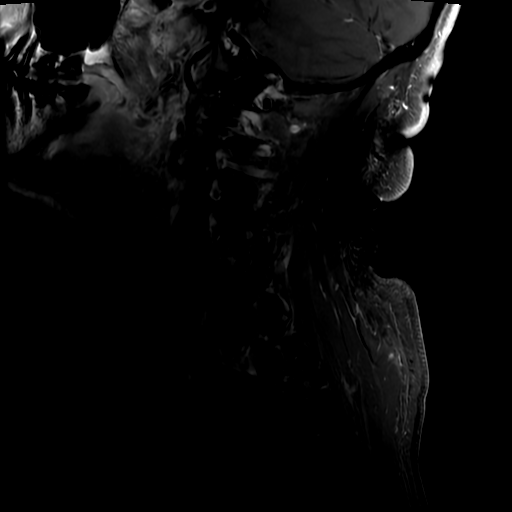
[im 8/16]
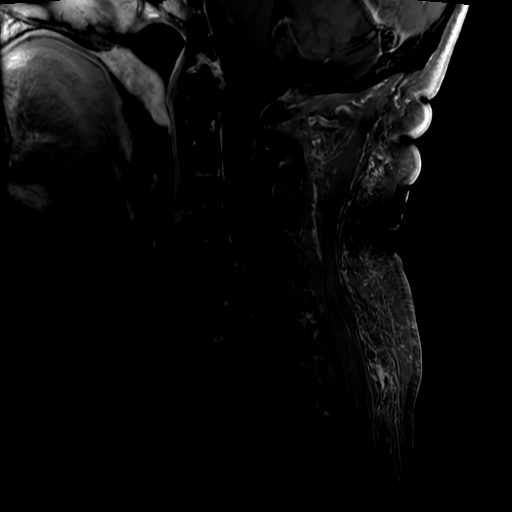
[im 16/16]
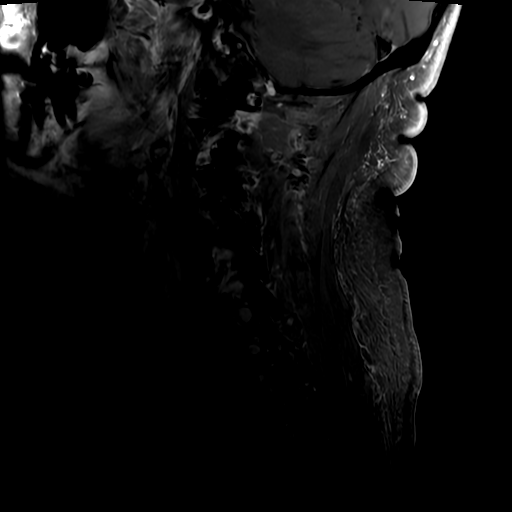

[17 of 48 positions shown; findings below may reference images not displayed]

FINDINGS: MRI CERVICAL SPINE FINDINGS

Alignment: Physiologic.

Vertebrae: Heterogeneous marrow signal, similar to remote MRI. No
focal lesions or abnormal enhancement to suggest metastatic disease.
There are multilevel endplate degenerative changes with probable
partial ankylosis across the C3-4, C5-6 and C6-7 disc space levels.
No evidence of acute fracture.

Cord: Normal in signal and caliber. No abnormal intradural
enhancement.

Posterior Fossa, vertebral arteries, paraspinal tissues: The
visualized posterior fossa appears unremarkable. No significant
paraspinal findings.

Disc levels:

C2-3: The disc appears normal. Asymmetric facet hypertrophy on the
left. No significant spinal stenosis.

C3-4: Spondylosis with disc bulging and bilateral uncinate spurring.
Probable ankylosis across the right facet joint and disc space.
Chronic osseous foraminal narrowing bilaterally without cord
deformity.

C4-5: Chronic spondylosis with disc bulging, uncinate spurring and
asymmetric right-sided facet hypertrophy. The CSF surrounding the
cord is effaced without cord deformity. Chronic osseous foraminal
narrowing bilaterally.

C5-6: Chronic spondylosis with posterior osteophytes and asymmetric
facet hypertrophy on the left. The CSF surrounding the cord is
effaced. Stable mild foraminal narrowing bilaterally.

C6-7: Stable spondylosis without cord deformity. Mild left foraminal
narrowing.

C7-T1: Stable disc bulging with posterior osteophytes. No cord
deformity or significant foraminal narrowing.

MRI THORACIC SPINE FINDINGS

Alignment:  Physiologic

Vertebrae: No evidence of acute fracture, metastatic disease or
abnormal marrow enhancement.

Cord: Normal in signal and caliber. No thoracic cord deformity or
abnormal intradural enhancement. The conus medullaris extends to the
L1 level.

Paraspinal and other soft tissues: No significant paraspinal
findings are identified. There is a T2 hyperintense lesion
posteriorly in the right hepatic lobe which is not well seen on the
postcontrast images, although probably reflects a cyst or
hemangioma.

Disc levels:

There are anterior endplate osteophytes in the lower thoracic spine.
Thoracic disc degeneration is minimal, and the thoracic spinal canal
is widely patent. No significant disc herniation, spinal stenosis or
nerve root encroachment identified.
IMPRESSION: 1. No evidence of metastatic disease or abnormal intradural
enhancement in the cervical or thoracic spine.
2. Stable multilevel cervical spondylosis with disc bulging,
uncinate spurring and facet hypertrophy as described. The CSF
surrounding the cord is effaced from C4-5 through C6-7 and there is
chronic foraminal narrowing, grossly unchanged from remote MRI.
3. Minimal thoracic disc degeneration without significant disc
herniation, spinal stenosis or nerve root encroachment.

## 2022-10-29 ENCOUNTER — Ambulatory Visit: Payer: 59 | Attending: Internal Medicine

## 2022-10-29 DIAGNOSIS — I5189 Other ill-defined heart diseases: Secondary | ICD-10-CM

## 2022-10-29 DIAGNOSIS — E785 Hyperlipidemia, unspecified: Secondary | ICD-10-CM

## 2022-10-29 DIAGNOSIS — E876 Hypokalemia: Secondary | ICD-10-CM

## 2022-10-29 DIAGNOSIS — Z79899 Other long term (current) drug therapy: Secondary | ICD-10-CM

## 2022-10-30 ENCOUNTER — Encounter: Payer: Self-pay | Admitting: Internal Medicine

## 2022-10-30 LAB — CBC
Hematocrit: 41.3 % (ref 37.5–51.0)
Hemoglobin: 13.6 g/dL (ref 13.0–17.7)
MCH: 31.2 pg (ref 26.6–33.0)
MCHC: 32.9 g/dL (ref 31.5–35.7)
MCV: 95 fL (ref 79–97)
Platelets: 199 10*3/uL (ref 150–450)
RBC: 4.36 x10E6/uL (ref 4.14–5.80)
RDW: 12.1 % (ref 11.6–15.4)
WBC: 5.6 10*3/uL (ref 3.4–10.8)

## 2022-10-30 LAB — BASIC METABOLIC PANEL
BUN/Creatinine Ratio: 22 (ref 10–24)
BUN: 19 mg/dL (ref 8–27)
CO2: 26 mmol/L (ref 20–29)
Calcium: 9.1 mg/dL (ref 8.6–10.2)
Chloride: 102 mmol/L (ref 96–106)
Creatinine, Ser: 0.87 mg/dL (ref 0.76–1.27)
Glucose: 97 mg/dL (ref 70–99)
Potassium: 4.6 mmol/L (ref 3.5–5.2)
Sodium: 138 mmol/L (ref 134–144)
eGFR: 88 mL/min/{1.73_m2} (ref >59)

## 2022-10-30 LAB — NMR, LIPOPROFILE
Cholesterol, Total: 188 mg/dL (ref 100–199)
HDL Particle Number: 40.5 umol/L (ref >=30.5)
HDL-C: 90 mg/dL (ref >39)
LDL Particle Number: 842 nmol/L (ref <1000)
LDL Size: 21.5 nm (ref >20.5)
LDL-C (NIH Calc): 88 mg/dL (ref 0–99)
LP-IR Score: 36 (ref <=45)
Small LDL Particle Number: <90 nmol/L (ref <=527)
Triglycerides: 54 mg/dL (ref 0–149)

## 2022-10-30 LAB — VITAMIN D 25 HYDROXY (VIT D DEFICIENCY, FRACTURES): Vit D, 25-Hydroxy: 63.8 ng/mL (ref 30.0–100.0)

## 2022-10-30 LAB — HEMOGLOBIN A1C
Est. average glucose Bld gHb Est-mCnc: 108 mg/dL
Hgb A1c MFr Bld: 5.4 % (ref 4.8–5.6)

## 2022-10-30 NOTE — Progress Notes (Unsigned)
Cardiology Office Note   Date:  10/31/2022   ID:  Adam, Gaines August 02, 1945, MRN [id number]  PCP:  Adam Inch, MD  Cardiologist:   Adam Pates, MD   F/U of dyslipidemia     History of Present Illness: Adam Gaines is a 77 y.o. male with a history of prostate cancer,  hyperlipidemia and dyspnea He also has a FHx of CAD  Calcum score CT in 2013 was 0 Echocardiogram showed preserved LVEF with reported grade 2 diastolic dysfunction (appears indeterminate to normal)  and mild aortic regurgitation. I last saw him in May 2023   Since seen he has done well  Denies CP  No SOB  Walks regularly    Active  Stretches  Plays golf    OVerall feels very good   Diet is relatively unchanged  Diet:  Breakfast;  Gr yogurt, high fiber cereal, fruit Lunch  Tuna fish salad or chicken salad Dinner   Chicken or salmon Drinks  Public relations account executive     Current Meds  Medication Sig   Ascorbic Acid (VITAMIN C PO) Take 500 mg by mouth daily.    atorvastatin (LIPITOR) 20 MG tablet Take 1 tablet by mouth daily.   b complex vitamins tablet Take 1 tablet by mouth daily.   celecoxib (CELEBREX) 200 MG capsule Take 200 mg by mouth as needed.   Cholecalciferol (VITAMIN D) 2000 UNITS tablet Take 1 tablet (2,000 Units total) by mouth daily. (Patient taking differently: Take 5,000 Units by mouth daily.)   CVS TRIPLE MAGNESIUM COMPLEX PO Take by mouth daily.   Probiotic Product (PROBIOTIC-10 PO) Take by mouth daily.   tamsulosin (FLOMAX) 0.4 MG CAPS capsule Take 0.4 mg by mouth. Take 1 tablets daily   TURMERIC PO Take 1 capsule by mouth daily.     Allergies:   Patient has no known allergies.   Past Medical History:  Diagnosis Date   AR (aortic regurgitation) 06/28/2016   Mild, noted on ECHO    Benign essential tremor 11/18/2011   Seen for this November 18, 2011    Bilateral cataracts    Cardiovascular risk factor 03/08/2012   Cervical spondylosis 12/18/2017   noted on CT   Chickenpox    Chronic  eczema    of the plantar aspect right foot   Colon polyps    Tubular Adenoma 2008, and polyps 2018   Diverticulosis    Diverticulosis of sigmoid colon 07/19/2016   noted on colonoscopy   DOE (dyspnea on exertion)    Family history of breast cancer    Family history of leukemia    Family history of prostate cancer    Grade II diastolic dysfunction 06/28/2016   noted on ECHO    Internal hemorrhoids 07/19/2016   noted on colonoscopy   Intestinal angiodysplasia    Lumbago    LVH (left ventricular hypertrophy) 06/28/2016   Mild, noted on ECHO    Other and unspecified hyperlipidemia    Personal history of other disorder of urinary system    Prostate cancer (HCC)    Seasonal allergies    TONSILLECTOMY AND ADENOIDECTOMY, HX OF 07/24/2007   Qualifier: Diagnosis of  By: Genelle Gather CMA, [place]     Vitamin B12 deficiency    Vitamin D deficiency     Past Surgical History:  Procedure Laterality Date   CATARACT EXTRACTION, BILATERAL     Left 06/25/2017, Right 07/09/2017   COLONOSCOPY     COLONOSCOPY W/ POLYPECTOMY  07/19/2016  left L5 hemilaminotomy with microdiskectomy     POLYPECTOMY     RADIOACTIVE SEED IMPLANT N/A 03/11/2018   Procedure: RADIOACTIVE SEED IMPLANT/BRACHYTHERAPY IMPLANT;  Surgeon: Crist Fat, MD;  Location: [facility];  Service: Urology;  Laterality: N/A;   repair of deviated septum     SPACE OAR INSTILLATION N/A 03/11/2018   Procedure: SPACE OAR INSTILLATION;  Surgeon: Crist Fat, MD;  Location: [facility];  Service: Urology;  Laterality: N/A;   TONSILLECTOMY       Social History:  The patient  reports that he quit smoking about 43 years ago. His smoking use included cigarettes. He has never used smokeless tobacco. He reports current alcohol use of about 5.0 standard drinks of alcohol per week. He reports that he does not use drugs.   Family History:  The patient's family history includes Breast cancer in his paternal  grandmother; COPD in his mother; Cancer in his maternal grandmother; Coronary artery disease in his father and mother; Diabetes in his maternal grandmother and paternal grandmother; Healthy in his brother, brother, daughter, and daughter; Heart attack (age of onset: 22) in his father; Heart attack (age of onset: 64) in his mother; Heart disease in his father and mother; Hyperlipidemia in his father; Hypertension in his father; Other in an other family member; Prostate cancer (age of onset: 70) in his brother.    ROS:  Please see the history of present illness. All other systems are reviewed and  Negative to the above problem except as noted.    PHYSICAL EXAM: VS:  BP 124/76   Pulse 68   Ht 5\' 7"  (1.702 m)   Wt 157 lb 3.2 oz (71.3 kg)   SpO2 96%   BMI 24.62 kg/m   GEN: Well nourished, well developed, in no acute distress  HEENT: normal  Neck: JVP is normal.  No carotid bruits Cardiac: RRR; Gr I/VI systollic murmur base  No  LE  edema  Respiratory:  clear to auscultation  GI: soft, nontender, nondistended,  No hepatomegaly  MS: no deformity Moving all extremities   Skin: warm and dry, no rash Neuro:  Strength and sensation are intact Psych: euthymic mood, full affect   EKG:  EKG is ordered today.  SR 62 bpm   First degree AV block  PR 214     Echo   2023  Echo:  - Left ventricle: The cavity size was normal. Wall thickness was   increased in a pattern of mild LVH. Systolic function was normal.   The estimated ejection fraction was in the range of 50% to 55%.   Wall motion was normal; there were no regional wall motion   abnormalities. Features are consistent with a pseudonormal left   ventricular filling pattern, with concomitant abnormal relaxation   and increased filling pressure (grade 2 diastolic dysfunction). - Aortic valve: There was mild regurgitation.   Lipid Panel    Component Value Date/Time   CHOL 203 (H) 10/11/2021 0729   CHOL 203 (H) 05/09/2014 0733   TRIG 34  10/11/2021 0729   TRIG 106 05/09/2014 0733   HDL 101 10/11/2021 0729   HDL 85 05/09/2014 0733   CHOLHDL 2.0 10/11/2021 0729   CHOLHDL 2 04/30/2017 0724   VLDL 15.2 04/30/2017 0724   LDLCALC 95 10/11/2021 0729   LDLCALC 97 05/09/2014 0733      Wt Readings from Last 3 Encounters:  10/31/22 157 lb 3.2 oz (71.3 kg)  10/19/21 154 lb  12.8 oz (70.2 kg)  07/19/21 152 lb (68.9 kg)      ASSESSMENT AND PLAN:  1 Hyperlipidemia   Lipids drawn just  a few days ago   LDL 88 with low particle number  HDL excellent at 90  Trig 54   Keep on same regimen    Ca score 0 in 2013     2  Hx AI   Mild on echo in 2018   No diastolic murmurs   3  Metabolics  A1C 5.4  Good  Watch carbs       Stay active  Keep same diet       Otherwise F/U in 1 year    Current medicines are reviewed at length with the patient today.  The patient does not have concerns regarding medicines.  Signed, Adam Pates, MD  10/31/2022 8:52 AM    [facility] [address] [place], [place], [place]  [address] Phone: [phone]; Fax: [phone]

## 2022-10-31 ENCOUNTER — Other Ambulatory Visit: Payer: Self-pay

## 2022-10-31 ENCOUNTER — Ambulatory Visit: Payer: 59 | Attending: Internal Medicine | Admitting: Internal Medicine

## 2022-10-31 ENCOUNTER — Encounter: Payer: Self-pay | Admitting: Internal Medicine

## 2022-10-31 VITALS — BP 124/76 | HR 68 | Ht 67.0 in | Wt 157.2 lb

## 2022-10-31 DIAGNOSIS — I5189 Other ill-defined heart diseases: Secondary | ICD-10-CM

## 2022-10-31 MED ORDER — ATORVASTATIN CALCIUM 20 MG PO TABS
20.0000 mg | ORAL_TABLET | Freq: Every day | ORAL | 3 refills | Status: DC
Start: 2022-10-31 — End: 2022-11-25

## 2022-10-31 NOTE — Patient Instructions (Signed)
Medication Instructions:   *If you need a refill on your cardiac medications before your next appointment, please call your pharmacy*   Lab Work:  If you have labs (blood work) drawn today and your tests are completely normal, you will receive your results only by: MyChart Message (if you have MyChart) OR A paper copy in the mail If you have any lab test that is abnormal or we need to change your treatment, we will call you to review the results.   Testing/Procedures:    Follow-Up: At [facility], you and your health needs are our priority.  As part of our continuing mission to provide you with exceptional heart care, we have created designated Provider Care Teams.  These Care Teams include your primary Cardiologist (physician) and Advanced Practice Providers (APPs -  Physician Assistants and Nurse Practitioners) who all work together to provide you with the care you need, when you need it.  We recommend signing up for the patient portal called "MyChart".  Sign up information is provided on this After Visit Summary.  MyChart is used to connect with patients for Virtual Visits (Telemedicine).  Patients are able to view lab/test results, encounter notes, upcoming appointments, etc.  Non-urgent messages can be sent to your provider as well.   To learn more about what you can do with MyChart, go to [email or web address].    Your next appointment:   1 year(s)  Provider:   Paula Ross, MD     Other Instructions   

## 2022-11-23 ENCOUNTER — Other Ambulatory Visit: Payer: Self-pay | Admitting: Internal Medicine

## 2022-11-23 DIAGNOSIS — I5189 Other ill-defined heart diseases: Secondary | ICD-10-CM

## 2023-06-05 ENCOUNTER — Other Ambulatory Visit: Payer: Self-pay

## 2023-06-05 DIAGNOSIS — I5189 Other ill-defined heart diseases: Secondary | ICD-10-CM

## 2023-06-05 MED ORDER — ATORVASTATIN CALCIUM 20 MG PO TABS
20.0000 mg | ORAL_TABLET | Freq: Every day | ORAL | 1 refills | Status: DC
Start: 2023-06-05 — End: 2023-10-20

## 2023-08-07 ENCOUNTER — Telehealth: Payer: Self-pay | Admitting: Internal Medicine

## 2023-08-07 DIAGNOSIS — Z79899 Other long term (current) drug therapy: Secondary | ICD-10-CM

## 2023-08-07 DIAGNOSIS — E785 Hyperlipidemia, unspecified: Secondary | ICD-10-CM

## 2023-08-07 DIAGNOSIS — E876 Hypokalemia: Secondary | ICD-10-CM

## 2023-08-07 DIAGNOSIS — I5189 Other ill-defined heart diseases: Secondary | ICD-10-CM

## 2023-08-07 NOTE — Telephone Encounter (Signed)
  Patient would like to know if she should have labs done before her next follow up scheduled in May. Please advise

## 2023-08-17 NOTE — Telephone Encounter (Signed)
 CBC, CMET, A1C, Lipomed, TSH and Vit D

## 2023-08-18 NOTE — Telephone Encounter (Signed)
 Pt advised that I will change their APP appt in may to see Dr Tenny Craw and his lab orders have been placed.

## 2023-09-23 NOTE — Telephone Encounter (Signed)
 Sent a message to the pt letting him know that I have not been yet able to move his and his wife appt to Dr Avanell Bob prior to their May appt with Geralyn Knee but they can have the labs done prior to that appt ordered by Dr Avanell Bob.

## 2023-09-23 NOTE — Telephone Encounter (Signed)
 Thanks Ann! I love being confused with you. You make me look good.

## 2023-10-09 LAB — NMR, LIPOPROFILE
Cholesterol, Total: 201 mg/dL — ABNORMAL HIGH (ref 100–199)
HDL Particle Number: 40.9 umol/L (ref >=30.5)
HDL-C: 114 mg/dL (ref >39)
LDL Particle Number: 562 nmol/L (ref <1000)
LDL Size: 21 nm (ref >20.5)
LDL-C (NIH Calc): 75 mg/dL (ref 0–99)
LP-IR Score: 27 (ref <=45)
Small LDL Particle Number: <90 nmol/L (ref <=527)
Triglycerides: 68 mg/dL (ref 0–149)

## 2023-10-09 LAB — CBC
Hematocrit: 44 % (ref 37.5–51.0)
Hemoglobin: 14.5 g/dL (ref 13.0–17.7)
MCH: 31.1 pg (ref 26.6–33.0)
MCHC: 33 g/dL (ref 31.5–35.7)
MCV: 94 fL (ref 79–97)
Platelets: 199 10*3/uL (ref 150–450)
RBC: 4.66 x10E6/uL (ref 4.14–5.80)
RDW: 12.4 % (ref 11.6–15.4)
WBC: 3.9 10*3/uL (ref 3.4–10.8)

## 2023-10-09 LAB — COMPREHENSIVE METABOLIC PANEL WITH GFR
ALT: 32 IU/L (ref 0–44)
AST: 41 IU/L — ABNORMAL HIGH (ref 0–40)
Albumin: 4.5 g/dL (ref 3.8–4.8)
Alkaline Phosphatase: 57 IU/L (ref 44–121)
BUN/Creatinine Ratio: 12 (ref 10–24)
BUN: 10 mg/dL (ref 8–27)
Bilirubin Total: 0.5 mg/dL (ref 0.0–1.2)
CO2: 25 mmol/L (ref 20–29)
Calcium: 9.9 mg/dL (ref 8.6–10.2)
Chloride: 101 mmol/L (ref 96–106)
Creatinine, Ser: 0.82 mg/dL (ref 0.76–1.27)
Globulin, Total: 1.7 g/dL (ref 1.5–4.5)
Glucose: 110 mg/dL — ABNORMAL HIGH (ref 70–99)
Potassium: 5.3 mmol/L — ABNORMAL HIGH (ref 3.5–5.2)
Sodium: 139 mmol/L (ref 134–144)
Total Protein: 6.2 g/dL (ref 6.0–8.5)
eGFR: 89 mL/min/{1.73_m2} (ref >59)

## 2023-10-09 LAB — VITAMIN D 25 HYDROXY (VIT D DEFICIENCY, FRACTURES): Vit D, 25-Hydroxy: 76.1 ng/mL (ref 30.0–100.0)

## 2023-10-09 LAB — TSH: TSH: 0.791 u[IU]/mL (ref 0.450–4.500)

## 2023-10-09 LAB — HEMOGLOBIN A1C
Est. average glucose Bld gHb Est-mCnc: 111 mg/dL
Hgb A1c MFr Bld: 5.5 % (ref 4.8–5.6)

## 2023-10-10 ENCOUNTER — Encounter: Payer: Self-pay | Admitting: Internal Medicine

## 2023-10-18 NOTE — Progress Notes (Signed)
 Cardiology Office Note   Date:  10/21/2023   ID:  Adam Gaines, Adam Gaines [birth year], MRN [id number]  PCP:  Emaline Handsome, MD  Cardiologist:   Ola Berger, MD   F/U of dyslipidemia     History of Present Illness: Adam Gaines is a 78 y.o. male with a history of prostate cancer,  hyperlipidemia and dyspnea He also has a FHx of CAD  Calcum score CT in 2013 was 0 Echocardiogram showed preserved LVEF with reported grade 2 diastolic dysfunction (appears indeterminate to normal)  and mild aortic regurgitation.  I saw the pt in May 2024  Since seen he has done well  He denies CP  Breathing is good   He continues to walk with Pel[employer] app    Doing well with that  No dizziness   Current Meds  Medication Sig   Ascorbic Acid (VITAMIN C PO) Take 500 mg by mouth daily.    atorvastatin  (LIPITOR) 20 MG tablet Take 1 tablet (20 mg total) by mouth daily.   b complex vitamins tablet Take 1 tablet by mouth daily.   celecoxib (CELEBREX) 200 MG capsule Take 200 mg by mouth as needed.   Cholecalciferol (VITAMIN D ) 2000 UNITS tablet Take 1 tablet (2,000 Units total) by mouth daily. (Patient taking differently: Take 5,000 Units by mouth daily.)   [employer] TRIPLE MAGNESIUM COMPLEX PO Take by mouth daily.   tamsulosin (FLOMAX) 0.4 MG CAPS capsule Take 0.4 mg by mouth. Take 1 tablets daily   [DISCONTINUED] atorvastatin  (LIPITOR) 20 MG tablet Take 1 tablet (20 mg total) by mouth daily.   [DISCONTINUED] atorvastatin  (LIPITOR) 20 MG tablet Take 1 tablet (20 mg total) by mouth daily.     Allergies:   Patient has no known allergies.   Past Medical History:  Diagnosis Date   AR (aortic regurgitation) 06/28/2016   Mild, noted on ECHO    Benign essential tremor 11/18/2011   Seen for this November 18, 2011    Bilateral cataracts    Cardiovascular risk factor 03/08/2012   Cervical spondylosis 12/18/2017   noted on CT   Chickenpox    Chronic eczema    of the plantar aspect right foot   Colon polyps    Tubular  Adenoma 2008, and polyps 2018   Diverticulosis    Diverticulosis of sigmoid colon 07/19/2016   noted on colonoscopy   DOE (dyspnea on exertion)    Family history of breast cancer    Family history of leukemia    Family history of prostate cancer    Grade II diastolic dysfunction 06/28/2016   noted on ECHO    Internal hemorrhoids 07/19/2016   noted on colonoscopy   Intestinal angiodysplasia    Lumbago    LVH (left ventricular hypertrophy) 06/28/2016   Mild, noted on ECHO    Other and unspecified hyperlipidemia    Personal history of other disorder of urinary system    Prostate cancer (HCC)    Seasonal allergies    TONSILLECTOMY AND ADENOIDECTOMY, HX OF 07/24/2007   Qualifier: Diagnosis of  By: Sharlette Dayhoff CMA, [place]     Vitamin B12 deficiency    Vitamin D  deficiency     Past Surgical History:  Procedure Laterality Date   CATARACT EXTRACTION, BILATERAL     Left 06/25/2017, Right 07/09/2017   COLONOSCOPY     COLONOSCOPY W/ POLYPECTOMY  07/19/2016   left L5 hemilaminotomy with microdiskectomy     POLYPECTOMY     RADIOACTIVE SEED IMPLANT N/A  03/11/2018   Procedure: RADIOACTIVE SEED IMPLANT/BRACHYTHERAPY IMPLANT;  Surgeon: Andrez Banker, MD;  Location: [facility];  Service: Urology;  Laterality: N/A;   repair of deviated septum     SPACE OAR INSTILLATION N/A 03/11/2018   Procedure: SPACE OAR INSTILLATION;  Surgeon: Andrez Banker, MD;  Location: [facility];  Service: Urology;  Laterality: N/A;   TONSILLECTOMY       Social History:  The patient  reports that he quit smoking about 44 years ago. His smoking use included cigarettes. He started smoking about 54 years ago. He has never used smokeless tobacco. He reports current alcohol use of about 5.0 standard drinks of alcohol per week. He reports that he does not use drugs.   Family History:  The patient's family history includes Breast cancer in his paternal grandmother; COPD in his mother;  Cancer in his maternal grandmother; Coronary artery disease in his father and mother; Diabetes in his maternal grandmother and paternal grandmother; Healthy in his brother, brother, daughter, and daughter; Heart attack (age of onset: 94) in his father; Heart attack (age of onset: 19) in his mother; Heart disease in his father and mother; Hyperlipidemia in his father; Hypertension in his father; Other in an other family member; Prostate cancer (age of onset: 56) in his brother.    ROS:  Please see the history of present illness. All other systems are reviewed and  Negative to the above problem except as noted.    PHYSICAL EXAM: VS:  BP 132/80   Pulse (!) 57   Ht 5\' 7"  (1.702 m)   Wt 156 lb 9.6 oz (71 kg)   SpO2 99%   BMI 24.53 kg/m   GEN: Well nourished, well developed, in no acute distress  HEENT: normal  Neck: JVP is normal.  No carotid bruits Cardiac: RRR; Gr I/VI systolic murmur  No  LE  edema  Respiratory:  clear to auscultation  GI: soft, nontender, no masses    EKG:  EKG is ordered today. SB  57 bpm     Echo   2023  Echo:  - Left ventricle: The cavity size was normal. Wall thickness was   increased in a pattern of mild LVH. Systolic function was normal.   The estimated ejection fraction was in the range of 50% to 55%.   Wall motion was normal; there were no regional wall motion   abnormalities. Features are consistent with a pseudonormal left   ventricular filling pattern, with concomitant abnormal relaxation   and increased filling pressure (grade 2 diastolic dysfunction). - Aortic valve: There was mild regurgitation.   Lipid Panel    Component Value Date/Time   CHOL 203 (H) 10/11/2021 0729   CHOL 203 (H) 05/09/2014 0733   TRIG 34 10/11/2021 0729   TRIG 106 05/09/2014 0733   HDL 101 10/11/2021 0729   HDL 85 05/09/2014 0733   CHOLHDL 2.0 10/11/2021 0729   CHOLHDL 2 04/30/2017 0724   VLDL 15.2 04/30/2017 0724   LDLCALC 95 10/11/2021 0729   LDLCALC 97 05/09/2014  0733      Wt Readings from Last 3 Encounters:  10/20/23 156 lb 9.6 oz (71 kg)  10/31/22 157 lb 3.2 oz (71.3 kg)  10/19/21 154 lb 12.8 oz (70.2 kg)      ASSESSMENT AND PLAN:  1 Hyperlipidemia      Ca score 0 in 2013   REcent LDL 75 with low particle number    low small particles  HDL 114 With FHx of CAD   Keep on current regimen    2  Hx AI   Mild on echo in 2018   No diastolic murmurs on exam today   3  Metabolics  A1C 5.5  Good  Continue to watch diet and stay active           Otherwise F/U in 1 year    Current medicines are reviewed at length with the patient today.  The patient does not have concerns regarding medicines.  Signed, Ola Berger, MD  10/21/2023 4:24 PM    [facility] [address] [place], [place], [place]  [address] Phone: [phone]; Fax: [phone]

## 2023-10-20 ENCOUNTER — Encounter: Payer: Self-pay | Admitting: Internal Medicine

## 2023-10-20 ENCOUNTER — Ambulatory Visit: Payer: Self-pay | Attending: Internal Medicine | Admitting: Internal Medicine

## 2023-10-20 VITALS — BP 132/80 | HR 57 | Ht 67.0 in | Wt 156.6 lb

## 2023-10-20 DIAGNOSIS — E785 Hyperlipidemia, unspecified: Secondary | ICD-10-CM | POA: Diagnosis not present

## 2023-10-20 DIAGNOSIS — I5189 Other ill-defined heart diseases: Secondary | ICD-10-CM | POA: Diagnosis present

## 2023-10-20 DIAGNOSIS — G250 Essential tremor: Secondary | ICD-10-CM | POA: Diagnosis present

## 2023-10-20 DIAGNOSIS — I351 Nonrheumatic aortic (valve) insufficiency: Secondary | ICD-10-CM | POA: Diagnosis not present

## 2023-10-20 MED ORDER — ATORVASTATIN CALCIUM 20 MG PO TABS
20.0000 mg | ORAL_TABLET | Freq: Every day | ORAL | 3 refills | Status: AC
Start: 2023-10-20 — End: ?

## 2023-10-20 MED ORDER — ATORVASTATIN CALCIUM 20 MG PO TABS: 20.0000 mg | ORAL_TABLET | Freq: Every day | ORAL | 3 refills | Status: DC

## 2023-10-20 NOTE — Patient Instructions (Signed)
 Medication Instructions:   *If you need a refill on your cardiac medications before your next appointment, please call your pharmacy*  Lab Work:  If you have labs (blood work) drawn today and your tests are completely normal, you will receive your results only by: MyChart Message (if you have MyChart) OR A paper copy in the mail If you have any lab test that is abnormal or we need to change your treatment, we will call you to review the results.  Testing/Procedures:   Follow-Up: One year with Dr Avanell Bob   At [facility], you and your health needs are our priority.  As part of our continuing mission to provide you with exceptional heart care, our providers are all part of one team.  This team includes your primary Cardiologist (physician) and Advanced Practice Providers or APPs (Physician Assistants and Nurse Practitioners) who all work together to provide you with the care you need, when you need it.  Your next appointment:   We recommend signing up for the patient portal called "MyChart".  Sign up information is provided on this After Visit Summary.  MyChart is used to connect with patients for Virtual Visits (Telemedicine).  Patients are able to view lab/test results, encounter notes, upcoming appointments, etc.  Non-urgent messages can be sent to your provider as well.   To learn more about what you can do with MyChart, go to [email or web address].   Other Instructions

## 2023-10-31 ENCOUNTER — Ambulatory Visit: Admitting: Physician Assistant

## 2023-11-04 ENCOUNTER — Encounter: Payer: Self-pay | Admitting: Internal Medicine

## 2023-11-04 DIAGNOSIS — E785 Hyperlipidemia, unspecified: Secondary | ICD-10-CM

## 2023-11-04 NOTE — Telephone Encounter (Signed)
 Please review and advise.

## 2023-11-05 NOTE — Telephone Encounter (Signed)
 Go ahead and check Lpa for both Erskin Zinda and Mr Hazelrigg I had not checked because lipids have been well controlled

## 2023-11-13 LAB — LIPOPROTEIN A (LPA): Lipoprotein (a): 131 nmol/L — ABNORMAL HIGH (ref <75.0)
